# Patient Record
Sex: Female | Born: 1940 | Race: White | Hispanic: No | Marital: Single | Smoking: Former smoker
Health system: Southern US, Community
[De-identification: ages and names within clinical notes are randomized; demographics above are authoritative.]

## PROBLEM LIST (undated history)

## (undated) DIAGNOSIS — C801 Malignant (primary) neoplasm, unspecified: Secondary | ICD-10-CM

## (undated) HISTORY — PX: APPENDECTOMY: SHX54

## (undated) HISTORY — PX: ABDOMINAL HYSTERECTOMY: SHX81

## (undated) HISTORY — PX: LUNG CANCER SURGERY: SHX702

---

## 1997-10-03 ENCOUNTER — Emergency Department (HOSPITAL_COMMUNITY): Admission: EM | Admit: 1997-10-03 | Discharge: 1997-10-03 | Payer: Self-pay | Admitting: Emergency Medicine

## 1998-02-12 ENCOUNTER — Emergency Department (HOSPITAL_COMMUNITY): Admission: EM | Admit: 1998-02-12 | Discharge: 1998-02-12 | Payer: Self-pay

## 1998-02-12 ENCOUNTER — Encounter: Payer: Self-pay | Admitting: Emergency Medicine

## 2007-12-31 ENCOUNTER — Ambulatory Visit (HOSPITAL_COMMUNITY): Admission: RE | Admit: 2007-12-31 | Discharge: 2007-12-31 | Payer: Self-pay | Admitting: Oncology

## 2008-01-08 ENCOUNTER — Ambulatory Visit: Payer: Self-pay | Admitting: Thoracic Surgery (Cardiothoracic Vascular Surgery)

## 2008-01-13 ENCOUNTER — Ambulatory Visit (HOSPITAL_COMMUNITY)
Admission: RE | Admit: 2008-01-13 | Discharge: 2008-01-13 | Payer: Self-pay | Admitting: Thoracic Surgery (Cardiothoracic Vascular Surgery)

## 2008-01-13 ENCOUNTER — Encounter: Payer: Self-pay | Admitting: Thoracic Surgery (Cardiothoracic Vascular Surgery)

## 2008-01-14 ENCOUNTER — Encounter: Payer: Self-pay | Admitting: Thoracic Surgery (Cardiothoracic Vascular Surgery)

## 2008-01-14 ENCOUNTER — Ambulatory Visit: Payer: Self-pay | Admitting: Thoracic Surgery (Cardiothoracic Vascular Surgery)

## 2008-01-14 ENCOUNTER — Ambulatory Visit (HOSPITAL_COMMUNITY)
Admission: RE | Admit: 2008-01-14 | Discharge: 2008-01-15 | Payer: Self-pay | Admitting: Thoracic Surgery (Cardiothoracic Vascular Surgery)

## 2010-06-23 IMAGING — PT NM PET TUM IMG SKULL BASE T - THIGH
6 series · 25 of 25 positions shown · non-contrast
Comparison: CT of the chest from 12/08/2007 (Fransisco Arthur)

CLINICAL DATA: Lung cancer

NUCLEAR MEDICINE PET/CT
TECHNIQUE: 17.2 mCi F-18 FDG was injected intravenously.  Full-
ring PET imaging was performed from the skull base through the mid-
thighs.  CT data was obtained and used for attenuation correction
and anatomic localization only.  (This was not acquired as a
diagnostic CT examination.)  Data regarding site of injection,
patient weight, post injection waiting period, and fasting blood
sugar is available in the PACS documentation.

[Series 1: pet ac · axial · 3.3mm · 4.69mm/px · z∈[-870,+0]mm · 5 of 267 slices shown]
[im 1/267]
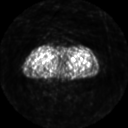
[im 67/267]
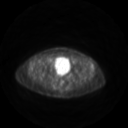
[im 134/267]
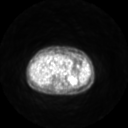
[im 200/267]
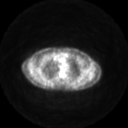
[im 267/267]
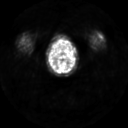

[Series 2: pet nac · axial · 3.3mm · 4.69mm/px · z∈[-870,+0]mm · 6 of 267 slices shown]
[im 1/267]
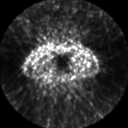
[im 54/267]
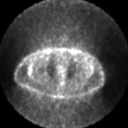
[im 107/267]
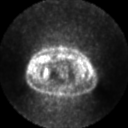
[im 160/267]
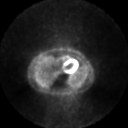
[im 213/267]
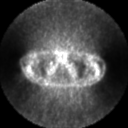
[im 267/267]
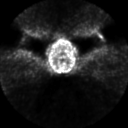

[Series 2: ct images · axial · 3.8mm · 0.98mm/px · z∈[-870,+0]mm · 6 of 267 slices shown]
[im 1/267]
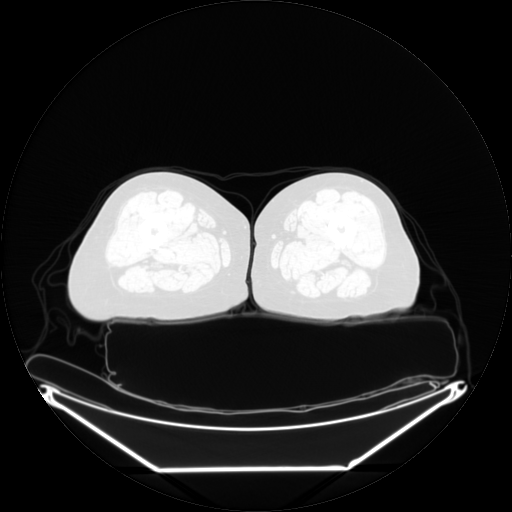
[im 54/267]
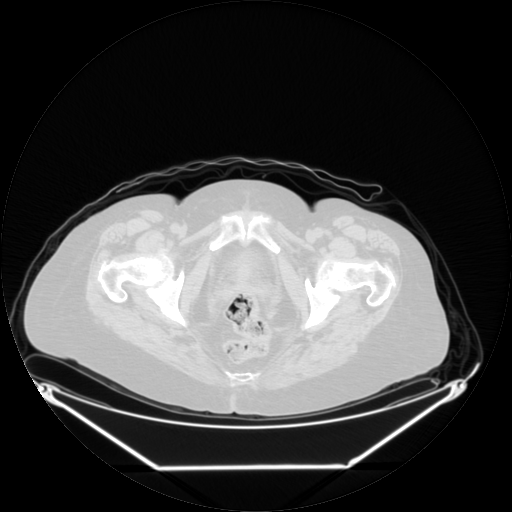
[im 107/267]
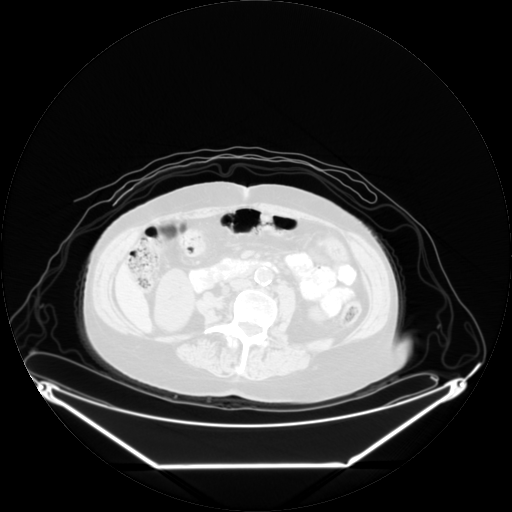
[im 160/267]
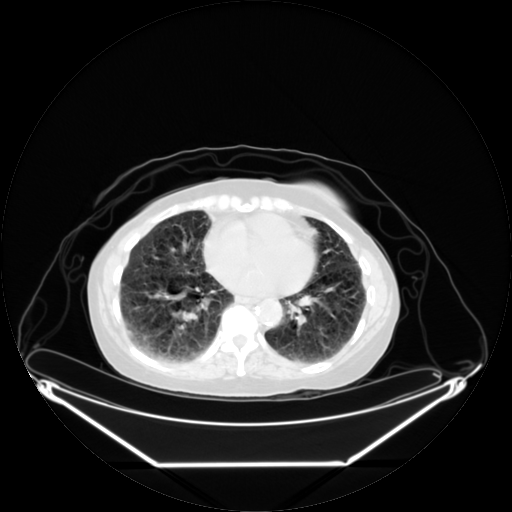
[im 213/267]
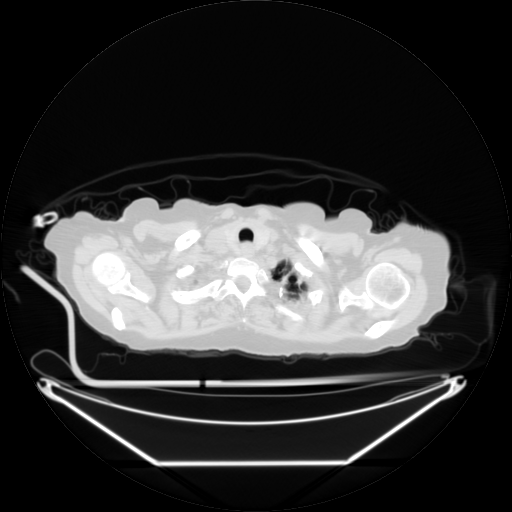
[im 267/267  brain]
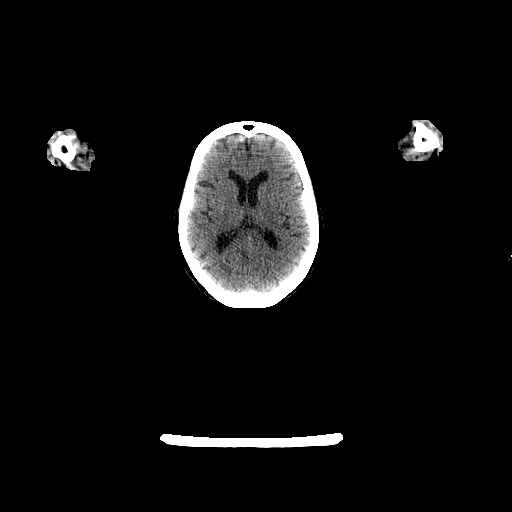

[Series 123: mip · coronal · 3.3mm · 4.69mm/px · 1 of 30 slices shown]
[im 1/30]
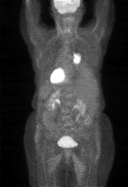

[Series 151: reformatted · axial · 3.3mm · 3.91mm/px · z∈[-870,+0]mm · 6 of 265 slices shown (1 of 2)]
[im 1/265]
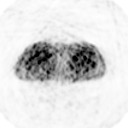
[im 53/265]
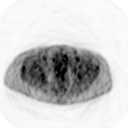
[im 106/265]
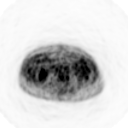
[im 159/265]
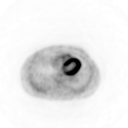
[im 212/265]
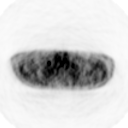
[im 265/265]
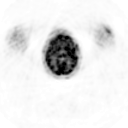

[Series 153: reformatted · coronal · 4.7mm · 6.98mm/px · 1 of 68 slices shown (2 of 2)]
[im 1/68]
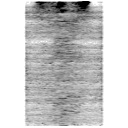

[25 of 25 positions shown; findings below may reference images not displayed]

FINDINGS: The right upper lobe mass has a maximum standard uptake
value of 15.3 grams per mL.

Physiologic activity in the neck is noted.  The 2.2 x 1.2 cm
precarinal lymph node does not demonstrate definite increased FDG
activity.  Very indistinct activity along the right hilum on image
85 of series of the fused PET CT series could conceivably represent
early right hilar adenopathy.

The 2.7 x 2.0 cm left adrenal mass has low density of 2 HU, but has
faintly increased FDG uptake, with a maximum standard uptake value
of 3.5 grams per mL.  Occasionally, adrenal adenomas can have
mildly increased uptake in this range, which is the most likely
scenario given the low density of this lesion.  A collision lesion
(with a metastatic focus superimposed on an underlying adenoma) is
a differential diagnostic consideration.

The right adrenal gland appears unremarkable.  A small hiatal
hernia is present.

There is atherosclerotic calcification of the abdominal aorta.  No
pathologic retroperitoneal adenopathy identified.

There is levoconvex lumbar scoliosis with a rotary component.

Severe emphysema is present.
IMPRESSION: 1. The right upper lobe mass is intensely hypermetabolic,
compatible with malignancy.
2.  The abnormally enlarged lower precarinal lymph node does not
demonstrate definite increased FDG activity.
3.  Faint right hilar activity raise the possibility of metastatic
disease to right hilar lymph nodes, but is nonspecific due to the
subtlety of this finding.
4.  Left adrenal mass has low density characteristics typical of
adenoma, but does have low-level FDG activity.  I favor this
representing the occasionally seen mild hypermetabolic activity in
an adenoma, but the possibility of a collision lesion is not
totally excluded, and attention to the adrenal gland on follow-up
imaging is recommended.
5.  Severe emphysema.
6.  Lumbar scoliosis and spondylosis.

## 2010-07-10 NOTE — Consult Note (Signed)
NEW PATIENT CONSULTATION   CHEMERE, STEFFLER  DOB:  [birth year]                                        January 08, 2008  CHART #:  [id number]   REASON FOR CONSULTATION:  Right upper lobe squamous cell carcinoma.   HISTORY OF PRESENT ILLNESS:  The patient is a 70 year old woman with a  chief complaint of cough and wheezing.  The patient has a long history  of tobacco abuse and known COPD.  She states she has been having  difficulty with bronchitis over the past year and a half.  Recently, she  had worsening of her cough as well as shortness of breath, both with  exertion and at rest.  A chest x-ray revealed a right lung mass.  She  subsequently had a CT which showed a 4.5 x 3.4 x 5.2 cm right upper lobe  mass.  There also was some mediastinal adenopathy and an enlarged left  adrenal gland.  She was subsequently referred to Dr. Rennis Harding.  A  PET scan was done which showed avid increased uptake in the primary  mass.  There also was increased uptake in some hilar nodes around the  left upper lobe bronchus.  There was not significant increased uptake in  the mediastinal nodes despite they are being approximately 2 cm in  diameter.  There was increased uptake in the adrenal gland, but it was  felt that this might be a FDG avid adenoma rather than metastasis.   PAST MEDICAL HISTORY:  Significant for COPD, arthritis.  She  specifically denies diabetes, hypertension, hypercholesterolemia, or  cardiac disease.   CURRENT MEDICATIONS:  Tiotropium bromide inhaled daily, albuterol 2  puffs inhaled as needed, fluticasone and salmeterol 1 puff b.i.d.  inhaled, Combivent 1 puff as needed, mometasone furoate monohydrate 2  puffs nightly, alprazolam 0.5 mg p.o. t.i.d. p.r.n., and benzonatate 100  mg daily.   She has no known drug allergies.   PAST SURGICAL HISTORY:  Significant for left thoracotomy for  pneumothorax in the 1980s and partial hysterectomy.   FAMILY  HISTORY:  No history of premature coronary artery disease.   SOCIAL HISTORY:  She is retired.  She used to work in Designer, fashion/clothing.  She  smoked as many as 3 packs per day, but most commonly 2 packs per day for  roughly 40 years, so 80-100 pack-year history of smoking.  She does not  use alcohol.  She says she quit smoking 2 months ago.   REVIEW OF SYSTEMS:  She has had 10 pounds weight loss.  She does have  chest pain which she describes as intermittent stabbing pain in her  right chest, it is infrequent, it is not anginal in nature.  She does  have shortness of breath with exertion, either walking or climbing  steps.  She is not sure if she can go up a full flight of stairs without  having to stop.  She has had some scant hemoptysis.  She does have  arthritis and joint pain.  She does have anxiety.  All other systems are  negative.   PHYSICAL EXAMINATION:  GENERAL:  The patient is a 70 year old woman, who  appears older than her stated age.  VITAL SIGNS:  Her blood pressure is 103/63, pulse 102, respirations 18,  and her oxygen saturation is 94% on room air.  NEUROLOGIC:  She is anxious, but alert and oriented x3 with no focal  deficits.  HEENT:  Unremarkable.  NECK:  Supple without thyromegaly, adenopathy, or bruits.  There is no  cervical, supraclavicular, axillary, or epitrochlear adenopathy.  LUNGS:  She coughs with a deep breath.  There is no wheezing at the  present time.  She does have distant breath sounds.  CARDIAC:  Regular rate and rhythm.  Normal S1 and S2.  No rubs, murmurs,  or gallops.  EXTREMITIES:  There is no peripheral edema.  She has 1+ posterior tibial  pulses and 2+ radial pulses bilaterally.   Her PET scan is reviewed.  Findings are as previously mentioned.  She  has a 5 cm right upper lobe mass with an SUV of 15.  There is increased  uptake in some hilar nodes.  The enlarged paratracheal nodes do not have  increased uptake and the left adrenal mass has question  adenoma, but  malignancy cannot be excluded.  She does have severe emphysematous  changes in both lungs on the lung windows.   PFTs were done in the office.  We are waiting the results for full PFT  testing, but in the office, her FEV-1 was 1.36 which is 52% predicted.  FEV-1 to FVC ratio is 65%.  Her needle biopsy showed squamous cell  carcinoma, moderate-to-poorly differentiated.   IMPRESSION:  The patient is a 70 year old woman with a history of heavy  tobacco abuse and chronic obstructive pulmonary disease, who presents  with a newly discovered right lung mass.  This by CT scan appeared to be  at least stage III if not stage IV disease.  However, by PET scan, it  would appear most likely to be a stage II lesion.  It is not definitive  staging by any stretch of the imagination.  She does need definitive  staging before potentially embarking on therapy.  I do have some  concerns about her ability to withstand a lobectomy or major thoracic  operation and await the results of her full PFT testing that was done in  [place] before making a final determination of that nature.  I do think  given the appearance on CT scan of the lymph nodes with them being  enlarged, there is a possibility of a false negative PET.  She needs a  mediastinoscopy for definitive staging or mediastinal lymph nodes.  However, before we do that, I would recommend that we at least attempted  needle biopsy of this adrenal.  It may well be an adenoma that just has  some increased uptake, but obviously if there were metastatic disease in  the adrenal gland, she would be stage IV.  No surgery would be  warranted.  We are in an attempt to schedule her for a needle biopsy of  the adrenal gland and Interventional Radiology, and then I discussed  with her the indications, risks, benefits, and alternatives for staging  with mediastinoscopy.  She understands this is a diagnostic and not  therapeutic procedure.  She does  understand the need for general  anesthesia.  She understands that the risks include but not limited to  death, bleeding, possible need for transfusion, possible need for larger  operation to control bleeding, infection, recurrent nerve injury,  esophageal injury, and pneumothorax.  She understands and accepts these  risks and agrees to proceed.  We will tentatively plan to proceed with  surgery on January 14, 2008, which is next Thursday, assuming we can  get the needle biopsy done before that time.  Obviously, we do not want  to put her through general anesthesia and the risk of mediastinoscopy if  the needle biopsy was positive.   Salvatore Decent Dorris Fetch, M.D.  Electronically Signed   SCH/MEDQ  D:  01/08/2008  T:  01/09/2008  Job:  [id number]   cc:   Weston Settle, MD  Tanvir A. Chodri, M.D.  [facility]

## 2010-07-10 NOTE — Op Note (Signed)
NAME:  Pamela Benton, Pamela Benton             ACCOUNT NO.:  [id number]   MEDICAL RECORD NO.:  [id number]          PATIENT TYPE:  OIB   LOCATION:  2002                         FACILITY:  [facility]   PHYSICIAN:  Salvatore Decent. Dorris Fetch, M.D.DATE OF BIRTH:  08-09-1940   DATE OF PROCEDURE:  01/14/2008  DATE OF DISCHARGE:                               OPERATIVE REPORT   PREOPERATIVE DIAGNOSIS:  Right upper lobe squamous cell carcinoma with  mediastinal adenopathy.   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   PROCEDURE:  Mediastinoscopy.   SURGEON:  Salvatore Decent. Dorris Fetch, MD   ANESTHESIA:  General.   FINDINGS:  Large granular level 7 nodes, markedly enlarged fleshy level  4R node x2.   CLINICAL NOTE:  Ms. Fuentes is a 70 year old woman with a history of  tobacco abuse, who presents with a new right upper lobe mass.  This was  biopsy proven to be squamous cell carcinoma.  A CT showed mediastinal  adenopathy as well as enlarged adrenal gland.  A PET scan showed intense  uptake in the primary lesion, no significant uptake in the mediastinal  nodes, and moderate uptake in the adrenal lesion.  She underwent needle  biopsy of the adrenal gland, which shows consistent with a benign  adenoma and now is undergoing these mediastinoscopy for formal staging.  The indications, risks, benefits, and alternatives were discussed in  detail with the patient.  She understands and accepts the risks and  agrees to proceed.   OPERATIVE NOTE:  Ms. Gammon was brought to the preop holding area on  January 14, 2008.  There, the Anesthesia Service established  intravenous access and placed an arterial blood pressure monitoring  line.  Intravenous antibiotics were administered.  She was taken to the  operating room, anesthetized, and intubated.  The chest and neck were  prepped and draped in usual sterile fashion.  A transverse incision was  made one fingerbreadth above  the sternal notch, it was carried through  the skin and subcutaneous tissue, and hemostasis was achieved with  electrocautery.  The strap muscles were separated in the midline.  The  pretracheal fascia was identified and incised.  The pretracheal plane  was developed bluntly into the mediastinum.  Mediastinoscope then was  inserted and systematic inspection of the mediastinal lymph nodes was  carried out.  What appeared to be a level 4R node was identified, with  aspiration this turned out to be azygos vein.  Bleeding was controlled  with Surgicel and packing for 5 minutes.  Packing then was removed, and  there was no ongoing bleeding from that site.  The subcarinal space then  was inspected and enlarged granular lymph nodes were seen in that area.  Biopsies were taken from 2 nodes, one of these was sent for frozen  section, and the other was sent only for permanent section.  Frozen  section ultimately returned with no sign of malignancy.  There were no  identifiable nodes in the 4L distribution consistent with the CT scan  findings.  Next, the level 4R nodes were inspected and an  enlarged  fleshy grayish node was identified.  This was biopsied.  A smaller node  with similar appearance likewise was biopsied.  These nodes were  relatively vascular and they were sent for permanent section.  The wound  then was packed with gauze for 5 minutes.  The gauze packing was  removed.  The mediastinoscope was reinserted.  Systematic inspection for  hemostasis revealed no ongoing bleeding.  Mediastinoscope was withdrawn.  The incision was closed in 2 layers with a 3-0 Vicryl  subcutaneous closure and a 4-0 Vicryl subcuticular closure.  All sponge,  needle, and instrument counts were correct at the end of the procedure.  The patient was extubated in the operating room and taken to recovery  room in good condition.      Salvatore Decent Dorris Fetch, M.D.  Electronically Signed     SCH/MEDQ  D:   01/14/2008  T:  01/14/2008  Job:  [id number]   cc:   Weston Settle, MD  Tanvir A. Chodri, M.D.  [facility]

## 2010-11-27 LAB — GLUCOSE, CAPILLARY: Glucose-Capillary: 90

## 2010-11-28 LAB — CBC
HCT: 33.4 — ABNORMAL LOW
Hemoglobin: 11.3 — ABNORMAL LOW
MCV: 84.9
Platelets: 247
RBC: 3.85 — ABNORMAL LOW
RDW: 14.9
WBC: 9.8

## 2010-11-28 LAB — COMPREHENSIVE METABOLIC PANEL
ALT: 10
AST: 13
Albumin: 3 — ABNORMAL LOW
Alkaline Phosphatase: 67
Alkaline Phosphatase: 67
BUN: 7
Chloride: 109
GFR calc Af Amer: >60
Glucose, Bld: 103 — ABNORMAL HIGH
Potassium: 3 — ABNORMAL LOW
Potassium: 3.5
Sodium: 140
Total Bilirubin: 0.3
Total Bilirubin: 0.4
Total Protein: 6.8

## 2010-11-28 LAB — TYPE AND SCREEN
Antibody Screen: POSITIVE
PT AG Type: POSITIVE

## 2010-11-28 LAB — APTT: aPTT: 34

## 2010-11-28 LAB — PROTIME-INR
INR: 1.1
Prothrombin Time: 14.1

## 2013-10-13 DIAGNOSIS — R413 Other amnesia: Secondary | ICD-10-CM

## 2015-04-21 ENCOUNTER — Inpatient Hospital Stay (HOSPITAL_COMMUNITY)
Admission: EM | Admit: 2015-04-21 | Discharge: 2015-04-29 | DRG: 193 | Disposition: A | Discharge status: Hospice | Payer: PPO | Attending: Internal Medicine | Admitting: Internal Medicine

## 2015-04-21 ENCOUNTER — Emergency Department (HOSPITAL_COMMUNITY): Payer: PPO

## 2015-04-21 ENCOUNTER — Encounter (HOSPITAL_COMMUNITY): Payer: Self-pay | Admitting: *Deleted

## 2015-04-21 DIAGNOSIS — J9621 Acute and chronic respiratory failure with hypoxia: Secondary | ICD-10-CM | POA: Diagnosis present

## 2015-04-21 DIAGNOSIS — Z85118 Personal history of other malignant neoplasm of bronchus and lung: Secondary | ICD-10-CM

## 2015-04-21 DIAGNOSIS — J111 Influenza due to unidentified influenza virus with other respiratory manifestations: Secondary | ICD-10-CM

## 2015-04-21 DIAGNOSIS — Z90710 Acquired absence of both cervix and uterus: Secondary | ICD-10-CM

## 2015-04-21 DIAGNOSIS — E876 Hypokalemia: Secondary | ICD-10-CM | POA: Diagnosis not present

## 2015-04-21 DIAGNOSIS — R0602 Shortness of breath: Secondary | ICD-10-CM | POA: Diagnosis not present

## 2015-04-21 DIAGNOSIS — E860 Dehydration: Secondary | ICD-10-CM | POA: Diagnosis present

## 2015-04-21 DIAGNOSIS — Z7189 Other specified counseling: Secondary | ICD-10-CM | POA: Insufficient documentation

## 2015-04-21 DIAGNOSIS — Z9981 Dependence on supplemental oxygen: Secondary | ICD-10-CM

## 2015-04-21 DIAGNOSIS — Z7952 Long term (current) use of systemic steroids: Secondary | ICD-10-CM

## 2015-04-21 DIAGNOSIS — E871 Hypo-osmolality and hyponatremia: Secondary | ICD-10-CM

## 2015-04-21 DIAGNOSIS — J440 Chronic obstructive pulmonary disease with acute lower respiratory infection: Secondary | ICD-10-CM | POA: Diagnosis present

## 2015-04-21 DIAGNOSIS — J441 Chronic obstructive pulmonary disease with (acute) exacerbation: Secondary | ICD-10-CM | POA: Diagnosis not present

## 2015-04-21 DIAGNOSIS — R0902 Hypoxemia: Secondary | ICD-10-CM

## 2015-04-21 DIAGNOSIS — Z7401 Bed confinement status: Secondary | ICD-10-CM

## 2015-04-21 DIAGNOSIS — J449 Chronic obstructive pulmonary disease, unspecified: Secondary | ICD-10-CM | POA: Diagnosis not present

## 2015-04-21 DIAGNOSIS — Z8701 Personal history of pneumonia (recurrent): Secondary | ICD-10-CM

## 2015-04-21 DIAGNOSIS — Z87891 Personal history of nicotine dependence: Secondary | ICD-10-CM

## 2015-04-21 DIAGNOSIS — Z515 Encounter for palliative care: Secondary | ICD-10-CM

## 2015-04-21 DIAGNOSIS — J101 Influenza due to other identified influenza virus with other respiratory manifestations: Secondary | ICD-10-CM | POA: Diagnosis not present

## 2015-04-21 DIAGNOSIS — I4581 Long QT syndrome: Secondary | ICD-10-CM | POA: Diagnosis present

## 2015-04-21 DIAGNOSIS — J9620 Acute and chronic respiratory failure, unspecified whether with hypoxia or hypercapnia: Secondary | ICD-10-CM

## 2015-04-21 DIAGNOSIS — Z79899 Other long term (current) drug therapy: Secondary | ICD-10-CM

## 2015-04-21 DIAGNOSIS — Z9049 Acquired absence of other specified parts of digestive tract: Secondary | ICD-10-CM

## 2015-04-21 DIAGNOSIS — Z66 Do not resuscitate: Secondary | ICD-10-CM

## 2015-04-21 DIAGNOSIS — IMO0001 Reserved for inherently not codable concepts without codable children: Secondary | ICD-10-CM | POA: Diagnosis present

## 2015-04-21 DIAGNOSIS — Z902 Acquired absence of lung [part of]: Secondary | ICD-10-CM

## 2015-04-21 HISTORY — DX: Malignant (primary) neoplasm, unspecified: C80.1

## 2015-04-21 LAB — COMPREHENSIVE METABOLIC PANEL
ALK PHOS: 73 U/L (ref 38–126)
ALT: 25 U/L (ref 14–54)
ANION GAP: 13 (ref 5–15)
AST: 33 U/L (ref 15–41)
Albumin: 3.4 g/dL — ABNORMAL LOW (ref 3.5–5.0)
BILIRUBIN TOTAL: 0.9 mg/dL (ref 0.3–1.2)
BUN: 17 mg/dL (ref 6–20)
CALCIUM: 8.3 mg/dL — AB (ref 8.9–10.3)
CO2: 29 mmol/L (ref 22–32)
Chloride: 90 mmol/L — ABNORMAL LOW (ref 101–111)
Creatinine, Ser: 1.01 mg/dL — ABNORMAL HIGH (ref 0.44–1.00)
GFR calc non Af Amer: 53 mL/min — ABNORMAL LOW (ref >60)
Glucose, Bld: 100 mg/dL — ABNORMAL HIGH (ref 65–99)
Potassium: 2.9 mmol/L — ABNORMAL LOW (ref 3.5–5.1)
Sodium: 132 mmol/L — ABNORMAL LOW (ref 135–145)
TOTAL PROTEIN: 6.4 g/dL — AB (ref 6.5–8.1)

## 2015-04-21 LAB — CBC WITH DIFFERENTIAL/PLATELET
Basophils Absolute: 0 10*3/uL (ref 0.0–0.1)
Basophils Relative: 0 %
Eosinophils Absolute: 0 10*3/uL (ref 0.0–0.7)
Eosinophils Relative: 0 %
HEMATOCRIT: 32.4 % — AB (ref 36.0–46.0)
HEMOGLOBIN: 10.5 g/dL — AB (ref 12.0–15.0)
LYMPHS ABS: 0.2 10*3/uL — AB (ref 0.7–4.0)
Lymphocytes Relative: 5 %
MCH: 26.8 pg (ref 26.0–34.0)
MCHC: 32.4 g/dL (ref 30.0–36.0)
MCV: 82.7 fL (ref 78.0–100.0)
MONOS PCT: 6 %
Monocytes Absolute: 0.3 10*3/uL (ref 0.1–1.0)
NEUTROS ABS: 3.8 10*3/uL (ref 1.7–7.7)
NEUTROS PCT: 89 %
Platelets: 167 10*3/uL (ref 150–400)
RBC: 3.92 MIL/uL (ref 3.87–5.11)
RDW: 16.7 % — ABNORMAL HIGH (ref 11.5–15.5)
WBC: 4.2 10*3/uL (ref 4.0–10.5)

## 2015-04-21 LAB — TROPONIN I: Troponin I: <0.03 ng/mL (ref <0.031)

## 2015-04-21 MED ORDER — METHYLPREDNISOLONE SODIUM SUCC 40 MG IJ SOLR
40.0000 mg | Freq: Two times a day (BID) | INTRAMUSCULAR | Status: DC
  Administered 2015-04-21 – 2015-04-23 (×4): 40 mg via INTRAVENOUS
  Filled 2015-04-21 (×6): qty 1

## 2015-04-21 MED ORDER — ENOXAPARIN SODIUM 40 MG/0.4ML ~~LOC~~ SOLN
40.0000 mg | SUBCUTANEOUS | Status: DC
  Administered 2015-04-21 – 2015-04-28 (×8): 40 mg via SUBCUTANEOUS
  Filled 2015-04-21 (×8): qty 0.4

## 2015-04-21 MED ORDER — POTASSIUM CHLORIDE CRYS ER 20 MEQ PO TBCR
40.0000 meq | EXTENDED_RELEASE_TABLET | ORAL | Status: AC
  Administered 2015-04-21 (×2): 40 meq via ORAL
  Filled 2015-04-21 (×2): qty 2

## 2015-04-21 MED ORDER — IOHEXOL 350 MG/ML SOLN
100.0000 mL | Freq: Once | INTRAVENOUS | Status: AC | PRN
  Administered 2015-04-21: 100 mL via INTRAVENOUS

## 2015-04-21 MED ORDER — MEMANTINE HCL ER 28 MG PO CP24
28.0000 mg | ORAL_CAPSULE | Freq: Every day | ORAL | Status: DC
  Administered 2015-04-22 – 2015-04-29 (×8): 28 mg via ORAL
  Filled 2015-04-21 (×8): qty 1

## 2015-04-21 MED ORDER — IPRATROPIUM BROMIDE 0.02 % IN SOLN
0.5000 mg | Freq: Once | RESPIRATORY_TRACT | Status: AC
  Administered 2015-04-21: 0.5 mg via RESPIRATORY_TRACT
  Filled 2015-04-21: qty 2.5

## 2015-04-21 MED ORDER — ONDANSETRON HCL 4 MG/2ML IJ SOLN: 4.0000 mg | Freq: Four times a day (QID) | INTRAMUSCULAR | Status: DC | PRN

## 2015-04-21 MED ORDER — HYDROCOD POLST-CPM POLST ER 10-8 MG/5ML PO SUER
5.0000 mL | Freq: Two times a day (BID) | ORAL | Status: DC
  Administered 2015-04-21 – 2015-04-29 (×15): 5 mL via ORAL
  Filled 2015-04-21 (×15): qty 5

## 2015-04-21 MED ORDER — SODIUM CHLORIDE 0.9 % IV BOLUS (SEPSIS)
1000.0000 mL | Freq: Once | INTRAVENOUS | Status: AC
  Administered 2015-04-21: 1000 mL via INTRAVENOUS

## 2015-04-21 MED ORDER — ACETAMINOPHEN 650 MG RE SUPP: 650.0000 mg | Freq: Four times a day (QID) | RECTAL | Status: DC | PRN

## 2015-04-21 MED ORDER — IPRATROPIUM-ALBUTEROL 0.5-2.5 (3) MG/3ML IN SOLN
3.0000 mL | Freq: Four times a day (QID) | RESPIRATORY_TRACT | Status: DC
  Administered 2015-04-22 (×3): 3 mL via RESPIRATORY_TRACT
  Filled 2015-04-21 (×3): qty 3

## 2015-04-21 MED ORDER — ALBUTEROL SULFATE (2.5 MG/3ML) 0.083% IN NEBU
5.0000 mg | INHALATION_SOLUTION | Freq: Once | RESPIRATORY_TRACT | Status: AC
  Administered 2015-04-21: 5 mg via RESPIRATORY_TRACT
  Filled 2015-04-21: qty 6

## 2015-04-21 MED ORDER — ROFLUMILAST 500 MCG PO TABS
500.0000 ug | ORAL_TABLET | Freq: Every day | ORAL | Status: DC
  Administered 2015-04-22 – 2015-04-25 (×4): 500 ug via ORAL
  Filled 2015-04-21 (×4): qty 1

## 2015-04-21 MED ORDER — CITALOPRAM HYDROBROMIDE 20 MG PO TABS
20.0000 mg | ORAL_TABLET | Freq: Every day | ORAL | Status: DC
  Administered 2015-04-22 – 2015-04-29 (×8): 20 mg via ORAL
  Filled 2015-04-21 (×8): qty 1

## 2015-04-21 MED ORDER — POTASSIUM CHLORIDE 10 MEQ/100ML IV SOLN
10.0000 meq | INTRAVENOUS | Status: AC
  Administered 2015-04-21 (×2): 10 meq via INTRAVENOUS
  Filled 2015-04-21 (×2): qty 100

## 2015-04-21 MED ORDER — ALBUTEROL SULFATE (2.5 MG/3ML) 0.083% IN NEBU
2.5000 mg | INHALATION_SOLUTION | RESPIRATORY_TRACT | Status: DC | PRN
  Administered 2015-04-22: 2.5 mg via RESPIRATORY_TRACT
  Filled 2015-04-21: qty 3

## 2015-04-21 MED ORDER — ACETAMINOPHEN 325 MG PO TABS
650.0000 mg | ORAL_TABLET | Freq: Four times a day (QID) | ORAL | Status: DC | PRN
  Administered 2015-04-23 – 2015-04-25 (×2): 650 mg via ORAL
  Filled 2015-04-21 (×2): qty 2

## 2015-04-21 MED ORDER — FUROSEMIDE 40 MG PO TABS: 20.0000 mg | ORAL_TABLET | Freq: Every day | ORAL | Status: DC

## 2015-04-21 MED ORDER — METHYLPREDNISOLONE SODIUM SUCC 125 MG IJ SOLR
125.0000 mg | Freq: Once | INTRAMUSCULAR | Status: AC
  Administered 2015-04-21: 125 mg via INTRAVENOUS
  Filled 2015-04-21: qty 2

## 2015-04-21 MED ORDER — BUDESONIDE 0.25 MG/2ML IN SUSP
0.2500 mg | Freq: Two times a day (BID) | RESPIRATORY_TRACT | Status: DC
  Administered 2015-04-21 – 2015-04-29 (×16): 0.25 mg via RESPIRATORY_TRACT
  Filled 2015-04-21 (×18): qty 2

## 2015-04-21 MED ORDER — ONDANSETRON HCL 4 MG PO TABS: 4.0000 mg | ORAL_TABLET | Freq: Four times a day (QID) | ORAL | Status: DC | PRN

## 2015-04-21 NOTE — ED Notes (Signed)
Pt was eating some food and reports a sensation of "choking"  Pt able to talk in complete sentences.  No food was noted when this RN looked into pt's throat.  Lungs sounds clear bilaterally.  Pt was encouraged to cough.  After coughing, pt reports "choking" sensation has decreased.

## 2015-04-21 NOTE — ED Notes (Signed)
Pt was coughing prior to getting BP.  O2 sat dropped while pt was coughing.  Pt O2 improved after pt caught her breath.

## 2015-04-21 NOTE — Progress Notes (Signed)
EDCM spoke to patient and her son Mallie Mussel [facility]) at bedside.  Patient lives at home with her son Milbert Coulter during the week and then stays with either Mallie Mussel or or her son Abe People on the weekends.  Patient wears oxygen at home.  Patient's son reports patient has a concentrator that is on 4 liters and she also has a Marine scientist.  Patient reports her pcp is [place] [facility] in [place].  Has a pulmonologist Dr. Nila Nephew.  Patient was just recently discharged from [facility] on Wednesday of this week, for short term rehab after diagnosis of pneumonia.  Patient reports she cannot remember the name of the agency who provides her oxygen.  Patient to be admitted.  No further EDCM needs at this time.

## 2015-04-21 NOTE — ED Notes (Addendum)
Family states that pt was recently admitted and tx for Pneumonia in [place].  Pt showed a O2 de-saturation when pt was getting out of bed and standing.  O2 sat dropped to 77% on 4L.  Pt's O2 was able to return to 95% when pt rested.  Pt states she is on home O2 of 4-5L [place]. Dr. Venora Maples made aware.

## 2015-04-21 NOTE — ED Notes (Signed)
Respiratory called for breathing tx

## 2015-04-21 NOTE — ED Notes (Signed)
Per EMS report: pt coming from home and presents to the ED with respiratory distress.  Pt is a lung CA pt in remission.  Pt has had a productive cough for the past several days in which the pt did not call her MD about.  On EMS arrival, EMS noted upper wheezes on both sizes.  Pt is mission her left lower lobe. EMS gave pt 5mg  of albuterol with improvement to pt's wheezing. Pt a/o x 4 and normally ambulatory.

## 2015-04-21 NOTE — Progress Notes (Signed)
RT attempted to draw ABG and was unsuccessful. MD aware and order cancelled.

## 2015-04-21 NOTE — ED Notes (Signed)
Attempted to call floor for report , receiving Nurse unavailable at this time, will call back.

## 2015-04-21 NOTE — H&P (Addendum)
[facility] History and Physical  Pamela Benton [id number] DOB: 1940/12/02 DOA: 04/21/2015   PCP: No primary care provider on file.    Chief Complaint: cough, shortness of breath  HPI: Pamela Benton is a 75 y.o. female with COPD on 4 L of oxygen at home, history of lung cancer status post lobectomy who presents to the hospital for cough and dyspnea at rest. The patient was treated for pneumonia about a month ago and had improved however, developed a severe cough with shortness of breath at rest and with exertion a couple of days ago. Nebulizer treatments used at home did not help. She feels tightness in her chest. No complaint of fevers. Cough is productive of clear sputum. No sore throat and runny nose or watery eyes. She is noted to be quite short of breath in the ER and has persistent cough and is therefore being admitted.    General: The patient denies anorexia, fever, weight loss Cardiac: Denies chest pain, syncope, palpitations, pedal edema  Respiratory: Per history of present illness GI: Denies severe indigestion/heartburn, abdominal pain, nausea, vomiting, diarrhea and constipation GU: Denies hematuria, incontinence, dysuria  Musculoskeletal: Denies arthritis  Skin: Denies suspicious skin lesions Neurologic: Denies focal weakness or numbness, change in vision Psychiatry: Denies depression or anxiety. Hematologic: no easy bruising or bleeding  All other systems reviewed and found to be negative.  Past Medical History  Diagnosis Date  . Cancer ([place])     Lung CA    Past Surgical History  Procedure Laterality Date  . Abdominal hysterectomy    . Appendectomy    . Lung cancer surgery      Social History: She stopped smoking about 6 years ago, she does not drink alcohol Lives at home with husband    No Known Allergies  Family history:  No family history on file.    Prior to Admission medications   Medication Sig Start Date End Date Taking?  Authorizing Provider  ALPRAZolam Duanne Moron) 0.5 MG tablet Take 0.5 mg- she states she takes this only when necessary and has not been taking it on a daily basis    Yes Historical Provider, MD  citalopram (CELEXA) 20 MG tablet Take 20 mg by mouth daily.   Yes Historical Provider, MD  furosemide (LASIX) 20 MG tablet Take 20 mg by mouth daily.   Yes Historical Provider, MD  memantine (NAMENDA XR) 28 MG CP24 24 hr capsule Take 28 mg by mouth daily.   Yes Historical Provider, MD  roflumilast (DALIRESP) 500 MCG TABS tablet Take 500 mcg by mouth daily.   Yes Historical Provider, MD  She is also on prednisone 20 mg daily and a number of inhalers whose names she cannot recall   Physical Exam: Filed Vitals:   04/21/15 1450 04/21/15 1627 04/21/15 1630 04/21/15 1641  BP:  112/67 107/63   Pulse: 122 115 112   Temp:      TempSrc:      Resp: 19 24 26    SpO2: 96% 95% 96% 92%     General: Awake alert oriented 3, no acute distress HEENT: Normocephalic and Atraumatic, Mucous membranes pink                PERRLA; EOM intact; No scleral icterus,                 Nares: Patent, Oropharynx: Clear, Fair Dentition                 Neck: FROM,  no cervical lymphadenopathy, thyromegaly, carotid bruit or JVD;  Breasts: deferred CHEST WALL: No tenderness  CHEST: Noted to be short of breath at rest with a persistent cough-mild bilateral wheezing HEART: Regular rate and rhythm; no murmurs rubs or gallops - tachycardia BACK: No kyphosis or scoliosis; no CVA tenderness  GI: Positive Bowel Sounds, soft, non-tender; no masses, no organomegaly Rectal Exam: deferred MSK: No cyanosis, clubbing, or edema Genitalia: not examined  SKIN:  no rash or ulceration  CNS: Alert and Oriented x 4, Nonfocal exam, CN 2-12 intact  Labs on Admission:  Basic Metabolic Panel:  Recent Labs Lab 04/21/15 1408  NA 132*  K 2.9*  CL 90*  CO2 29  GLUCOSE 100*  BUN 17  CREATININE 1.01*  CALCIUM 8.3*   Liver Function  Tests:  Recent Labs Lab 04/21/15 1408  AST 33  ALT 25  ALKPHOS 73  BILITOT 0.9  PROT 6.4*  ALBUMIN 3.4*   No results for input(s): LIPASE, AMYLASE in the last 168 hours. No results for input(s): AMMONIA in the last 168 hours. CBC:  Recent Labs Lab 04/21/15 1408  WBC 4.2  NEUTROABS 3.8  HGB 10.5*  HCT 32.4*  MCV 82.7  PLT 167   Cardiac Enzymes:  Recent Labs Lab 04/21/15 1408  TROPONINI <0.03    BNP (last 3 results) No results for input(s): BNP in the last 8760 hours.  ProBNP (last 3 results) No results for input(s): PROBNP in the last 8760 hours.  CBG: No results for input(s): GLUCAP in the last 168 hours.  Radiological Exams on Admission: Dg Chest 2 View  04/21/2015  CLINICAL DATA:  Shortness of breath today. History of lung cancer. Initial encounter. EXAM: CHEST  2 VIEW COMPARISON:  None. FINDINGS: Coarse bibasilar airspace opacities are identified at appears most suggestive of fibrotic change. The lungs are emphysematous. A right hilar mass lesion is seen. Surgical clips are noted left axilla. There is no pneumothorax or pleural effusion. Heart size is normal. No pneumothorax. IMPRESSION: Emphysematous lungs with coarse bibasilar airspace opacities likely due to fibrosis although pneumonia cannot be excluded. Right hilar mass. Electronically Signed   By: Inge Rise M.D.   On: 04/21/2015 13:52   Ct Angio Chest Pe W/cm &/or Wo Cm  04/21/2015  CLINICAL DATA:  Respiratory distress in a patient with lung cancer. EXAM: CT ANGIOGRAPHY CHEST WITH CONTRAST TECHNIQUE: Multidetector CT imaging of the chest was performed using the standard protocol during bolus administration of intravenous contrast. Multiplanar CT image reconstructions and MIPs were obtained to evaluate the vascular anatomy. CONTRAST:  11mL OMNIPAQUE IOHEXOL 350 MG/ML SOLN COMPARISON:  None. FINDINGS: Mediastinum / Lymph Nodes: There is no axillary lymphadenopathy. No mediastinal lymphadenopathy. No  evidence for left hilar lymphadenopathy. 8 mm short axis lymph node is identified in the right hilum. The heart size is normal. No pericardial effusion. Coronary artery calcification is noted. No evidence for filling defect in the opacified pulmonary arteries although evaluation of the lower lobes is degraded by respiratory motion. No thoracic aortic aneurysm. No evidence for dissection flap in the thoracic aorta. Lungs / Pleura: Moderate to severe emphysema noted bilaterally. Volume loss in the right hemi thorax is associated with confluent soft tissue attenuation in the anteromedial right chest which may be related to previous radiation therapy. No evidence for pulmonary edema. No pleural effusion. Upper Abdomen: 1.6 x 2.1 cm cystic lesion identified in the left upper abdomen has been incompletely visualized, but is suspicious for a cystic lesion of the  pancreatic tail. MSK / Soft Tissues: Bone windows reveal no worrisome lytic or sclerotic osseous lesions. Review of the MIP images confirms the above findings. IMPRESSION: 1. No CT evidence for acute pulmonary embolus. 2. 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 Comparison to previous imaging studies is recommended to ensure that this is stable as recurrent neoplasm could have this appearance. 3. 2.1 cm cystic lesion in the left upper abdomen has been incompletely visualized but is suspicious for a cystic process in the tail the pancreas. Pancreatic protocol CT or MR could be used to further evaluate. Electronically Signed   By: Misty Stanley M.D.   On: 04/21/2015 16:01    EKG: Independently reviewed. Sinus tachycardia-QTC is 512 ms  Assessment/Plan Principal Problem:   COPD exacerbation (HCC) -She has severe COPD and is on 4 L of oxygen and chronic prednisone at home -Has been given IV Solu-Medrol -continue nebulizer treatments-add Tussionex for  cough -Continue Daliresp -Ordered influenza swab and respiratory viral panel  Active Problems:   Hyponatremia -DC Lasix and given normal saline fluid bolus as she appears dehydrated    Hypokalemia -Possibly from [place] by mouth and IV-rechecked tomorrow morning  Prolonged QT -Avoid medications that were further prolong QT    Consulted:   Code Status: DO NOT RESUSCITATE  Family Communication: Husband at bedside  DVT Prophylaxis: Lovenox  Time spent: 55 minutes  [place], MD [facility]  If 7PM-7AM, please contact night-coverage [email or web address] 04/21/2015, 5:05 PM

## 2015-04-21 NOTE — ED Notes (Signed)
Pt de-sats with coughing.  Pt is O2 sats returns to 96% on 4L .

## 2015-04-21 NOTE — ED Provider Notes (Signed)
CSN: [id number]     Arrival date & time 04/21/15  1301 History   First MD Initiated Contact with Patient 04/21/15 1313     Chief Complaint  Patient presents with  . Respiratory Distress      HPI Patient presents to the emergency department with increasing shortness of breath over the past several days.  Patient was recently hospitalized in outside hospital for pneumonia.  She is no longer on antibiotics.  She does wear home oxygen 2 L.  She is brought to the ER by EMS and EMS gave 5 mg of albuterol with some improvement in the patient's wheezing.  She continues to complain of shortness of breath on arrival to the emergency department.  No history of DVT or pulmonary embolism.  She denies new unilateral leg swelling.  Denies fevers or chills.  No productive cough.  Her breathing is moderate in severity.   Past Medical History  Diagnosis Date  . Cancer ([place])     Lung CA   Past Surgical History  Procedure Laterality Date  . Abdominal hysterectomy    . Appendectomy    . Lung cancer surgery     No family history on file. Social History  Substance Use Topics  . Smoking status: Former Research scientist (life sciences)  . Smokeless tobacco: None  . Alcohol Use: No   OB History    No data available     Review of Systems  All other systems reviewed and are negative.     Allergies  Review of patient's allergies indicates no known allergies.  Home Medications   Prior to Admission medications   Medication Sig Start Date End Date Taking? Authorizing Provider  ALPRAZolam Duanne Moron) 0.5 MG tablet Take 0.5 mg by mouth daily.   Yes Historical Provider, MD  citalopram (CELEXA) 20 MG tablet Take 20 mg by mouth daily.   Yes Historical Provider, MD  furosemide (LASIX) 20 MG tablet Take 20 mg by mouth daily.   Yes Historical Provider, MD  memantine (NAMENDA XR) 28 MG CP24 24 hr capsule Take 28 mg by mouth daily.   Yes Historical Provider, MD  roflumilast (DALIRESP) 500 MCG TABS tablet Take 500 mcg by mouth daily.    Yes Historical Provider, MD   BP 140/64 mmHg  Pulse 122  Temp(Src) 98.3 F (36.8 C) (Oral)  Resp 19  SpO2 96% Physical Exam  Constitutional: She is oriented to person, place, and time. She appears well-developed and well-nourished. No distress.  HENT:  Head: Normocephalic and atraumatic.  Eyes: EOM are normal.  Neck: Normal range of motion.  Cardiovascular: Normal rate, regular rhythm and normal heart sounds.   Pulmonary/Chest: Effort normal. She has wheezes.  Abdominal: Soft. She exhibits no distension. There is no tenderness.  Musculoskeletal: Normal range of motion. She exhibits no edema.  Neurological: She is alert and oriented to person, place, and time.  Skin: Skin is warm and dry.  Psychiatric: She has a normal mood and affect. Judgment normal.  Nursing note and vitals reviewed.   ED Course  Procedures (including critical care time) Labs Review Labs Reviewed  CBC WITH DIFFERENTIAL/PLATELET - Abnormal; Notable for the following:    Hemoglobin 10.5 (*)    HCT 32.4 (*)    RDW 16.7 (*)    Lymphs Abs 0.2 (*)    All other components within normal limits  COMPREHENSIVE METABOLIC PANEL - Abnormal; Notable for the following:    Sodium 132 (*)    Potassium 2.9 (*)    Chloride  90 (*)    Glucose, Bld 100 (*)    Creatinine, Ser 1.01 (*)    Calcium 8.3 (*)    Total Protein 6.4 (*)    Albumin 3.4 (*)    GFR calc non Af Amer 53 (*)    All other components within normal limits  TROPONIN I    Imaging Review Dg Chest 2 View  04/21/2015  CLINICAL DATA:  Shortness of breath today. History of lung cancer. Initial encounter. EXAM: CHEST  2 VIEW COMPARISON:  None. FINDINGS: Coarse bibasilar airspace opacities are identified at appears most suggestive of fibrotic change. The lungs are emphysematous. A right hilar mass lesion is seen. Surgical clips are noted left axilla. There is no pneumothorax or pleural effusion. Heart size is normal. No pneumothorax. IMPRESSION: Emphysematous  lungs with coarse bibasilar airspace opacities likely due to fibrosis although pneumonia cannot be excluded. Right hilar mass. Electronically Signed   By: Inge Rise M.D.   On: 04/21/2015 13:52   Ct Angio Chest Pe W/cm &/or Wo Cm  04/21/2015  CLINICAL DATA:  Respiratory distress in a patient with lung cancer. EXAM: CT ANGIOGRAPHY CHEST WITH CONTRAST TECHNIQUE: Multidetector CT imaging of the chest was performed using the standard protocol during bolus administration of intravenous contrast. Multiplanar CT image reconstructions and MIPs were obtained to evaluate the vascular anatomy. CONTRAST:  116mL OMNIPAQUE IOHEXOL 350 MG/ML SOLN COMPARISON:  None. FINDINGS: Mediastinum / Lymph Nodes: There is no axillary lymphadenopathy. No mediastinal lymphadenopathy. No evidence for left hilar lymphadenopathy. 8 mm short axis lymph node is identified in the right hilum. The heart size is normal. No pericardial effusion. Coronary artery calcification is noted. No evidence for filling defect in the opacified pulmonary arteries although evaluation of the lower lobes is degraded by respiratory motion. No thoracic aortic aneurysm. No evidence for dissection flap in the thoracic aorta. Lungs / Pleura: Moderate to severe emphysema noted bilaterally. Volume loss in the right hemi thorax is associated with confluent soft tissue attenuation in the anteromedial right chest which may be related to previous radiation therapy. No evidence for pulmonary edema. No pleural effusion. Upper Abdomen: 1.6 x 2.1 cm cystic lesion identified in the left upper abdomen has been incompletely visualized, but is suspicious for a cystic lesion of the pancreatic tail. MSK / Soft Tissues: Bone windows reveal no worrisome lytic or sclerotic osseous lesions. Review of the MIP images confirms the above findings. IMPRESSION: 1. No CT evidence for acute pulmonary embolus. 2. 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 Comparison to previous imaging studies is recommended to ensure that this is stable as recurrent neoplasm could have this appearance. 3. 2.1 cm cystic lesion in the left upper abdomen has been incompletely visualized but is suspicious for a cystic process in the tail the pancreas. Pancreatic protocol CT or MR could be used to further evaluate. Electronically Signed   By: Misty Stanley M.D.   On: 04/21/2015 16:01   I have personally reviewed and evaluated these images and lab results as part of my medical decision-making.   EKG Interpretation   Date/Time:  Friday April 21 2015 13:27:18 EST Ventricular Rate:  125 PR Interval:  95 QRS Duration: 63 QT Interval:  355 QTC Calculation: 512 R Axis:   20 Text Interpretation:  Sinus tachycardia Borderline repolarization  abnormality Prolonged QT interval No old tracing to compare Confirmed by  Jaspal Pultz  MD, Lennette Bihari ([id number]) on 04/21/2015 4:23:45 PM  MDM   Final diagnoses:  COPD exacerbation ([place])  Hypoxia    4:24 PM Patient is feeling better this time but she still not back to baseline.  Given her tachycardia the patient underwent CT scan of her chest to evaluate for PE.  There is no clear pneumonia or evidence of pulmonary embolism.  She will need to be admitted for ongoing bronchodilators.  IV steroids given.  COPD exacerbation.    Jola Schmidt, MD 04/21/15 [phone]

## 2015-04-22 DIAGNOSIS — J449 Chronic obstructive pulmonary disease, unspecified: Secondary | ICD-10-CM | POA: Diagnosis not present

## 2015-04-22 DIAGNOSIS — Z9049 Acquired absence of other specified parts of digestive tract: Secondary | ICD-10-CM | POA: Diagnosis not present

## 2015-04-22 DIAGNOSIS — E871 Hypo-osmolality and hyponatremia: Secondary | ICD-10-CM | POA: Diagnosis not present

## 2015-04-22 DIAGNOSIS — J9621 Acute and chronic respiratory failure with hypoxia: Secondary | ICD-10-CM | POA: Diagnosis present

## 2015-04-22 DIAGNOSIS — Z87891 Personal history of nicotine dependence: Secondary | ICD-10-CM | POA: Diagnosis not present

## 2015-04-22 DIAGNOSIS — J440 Chronic obstructive pulmonary disease with acute lower respiratory infection: Secondary | ICD-10-CM | POA: Diagnosis present

## 2015-04-22 DIAGNOSIS — Z90710 Acquired absence of both cervix and uterus: Secondary | ICD-10-CM | POA: Diagnosis not present

## 2015-04-22 DIAGNOSIS — E860 Dehydration: Secondary | ICD-10-CM | POA: Diagnosis present

## 2015-04-22 DIAGNOSIS — J441 Chronic obstructive pulmonary disease with (acute) exacerbation: Secondary | ICD-10-CM | POA: Diagnosis present

## 2015-04-22 DIAGNOSIS — Z79899 Other long term (current) drug therapy: Secondary | ICD-10-CM | POA: Diagnosis not present

## 2015-04-22 DIAGNOSIS — Z9981 Dependence on supplemental oxygen: Secondary | ICD-10-CM | POA: Diagnosis not present

## 2015-04-22 DIAGNOSIS — Z7189 Other specified counseling: Secondary | ICD-10-CM | POA: Diagnosis not present

## 2015-04-22 DIAGNOSIS — Z66 Do not resuscitate: Secondary | ICD-10-CM | POA: Diagnosis not present

## 2015-04-22 DIAGNOSIS — J9620 Acute and chronic respiratory failure, unspecified whether with hypoxia or hypercapnia: Secondary | ICD-10-CM | POA: Diagnosis not present

## 2015-04-22 DIAGNOSIS — Z902 Acquired absence of lung [part of]: Secondary | ICD-10-CM | POA: Diagnosis not present

## 2015-04-22 DIAGNOSIS — J101 Influenza due to other identified influenza virus with other respiratory manifestations: Secondary | ICD-10-CM | POA: Diagnosis present

## 2015-04-22 DIAGNOSIS — Z85118 Personal history of other malignant neoplasm of bronchus and lung: Secondary | ICD-10-CM | POA: Diagnosis not present

## 2015-04-22 DIAGNOSIS — Z8701 Personal history of pneumonia (recurrent): Secondary | ICD-10-CM | POA: Diagnosis not present

## 2015-04-22 DIAGNOSIS — I4581 Long QT syndrome: Secondary | ICD-10-CM | POA: Diagnosis present

## 2015-04-22 DIAGNOSIS — Z515 Encounter for palliative care: Secondary | ICD-10-CM | POA: Diagnosis not present

## 2015-04-22 DIAGNOSIS — E876 Hypokalemia: Secondary | ICD-10-CM | POA: Diagnosis not present

## 2015-04-22 DIAGNOSIS — Z7401 Bed confinement status: Secondary | ICD-10-CM | POA: Diagnosis not present

## 2015-04-22 DIAGNOSIS — R0602 Shortness of breath: Secondary | ICD-10-CM | POA: Diagnosis present

## 2015-04-22 DIAGNOSIS — Z7952 Long term (current) use of systemic steroids: Secondary | ICD-10-CM | POA: Diagnosis not present

## 2015-04-22 DIAGNOSIS — J111 Influenza due to unidentified influenza virus with other respiratory manifestations: Secondary | ICD-10-CM | POA: Diagnosis not present

## 2015-04-22 LAB — BASIC METABOLIC PANEL
Anion gap: 13 (ref 5–15)
BUN: 18 mg/dL (ref 6–20)
CHLORIDE: 99 mmol/L — AB (ref 101–111)
CO2: 24 mmol/L (ref 22–32)
CREATININE: 0.86 mg/dL (ref 0.44–1.00)
Calcium: 8.6 mg/dL — ABNORMAL LOW (ref 8.9–10.3)
GFR calc Af Amer: >60 mL/min (ref >60)
GFR calc non Af Amer: >60 mL/min (ref >60)
GLUCOSE: 89 mg/dL (ref 65–99)
Potassium: 4 mmol/L (ref 3.5–5.1)
SODIUM: 136 mmol/L (ref 135–145)

## 2015-04-22 LAB — MAGNESIUM: Magnesium: 2.1 mg/dL (ref 1.7–2.4)

## 2015-04-22 LAB — CBC
HEMATOCRIT: 33.7 % — AB (ref 36.0–46.0)
Hemoglobin: 10.5 g/dL — ABNORMAL LOW (ref 12.0–15.0)
MCH: 26.6 pg (ref 26.0–34.0)
MCHC: 31.2 g/dL (ref 30.0–36.0)
MCV: 85.5 fL (ref 78.0–100.0)
PLATELETS: 185 10*3/uL (ref 150–400)
RBC: 3.94 MIL/uL (ref 3.87–5.11)
RDW: 17 % — AB (ref 11.5–15.5)
WBC: 2.4 10*3/uL — ABNORMAL LOW (ref 4.0–10.5)

## 2015-04-22 LAB — INFLUENZA PANEL BY PCR (TYPE A & B)
H1N1 flu by pcr: NOT DETECTED
INFLAPCR: NEGATIVE
Influenza B By PCR: POSITIVE — AB

## 2015-04-22 MED ORDER — LORAZEPAM 2 MG/ML IJ SOLN
0.5000 mg | Freq: Once | INTRAMUSCULAR | Status: AC
  Administered 2015-04-22: 0.5 mg via INTRAVENOUS
  Filled 2015-04-22: qty 1

## 2015-04-22 MED ORDER — OSELTAMIVIR PHOSPHATE 75 MG PO CAPS
75.0000 mg | ORAL_CAPSULE | Freq: Two times a day (BID) | ORAL | Status: DC
  Administered 2015-04-22 – 2015-04-23 (×3): 75 mg via ORAL
  Filled 2015-04-22 (×4): qty 1

## 2015-04-22 MED ORDER — IPRATROPIUM-ALBUTEROL 0.5-2.5 (3) MG/3ML IN SOLN
3.0000 mL | RESPIRATORY_TRACT | Status: DC
  Administered 2015-04-22 – 2015-04-24 (×9): 3 mL via RESPIRATORY_TRACT
  Filled 2015-04-22 (×9): qty 3

## 2015-04-22 MED ORDER — BENZONATATE 100 MG PO CAPS
100.0000 mg | ORAL_CAPSULE | Freq: Three times a day (TID) | ORAL | Status: DC | PRN
  Administered 2015-04-22 – 2015-04-24 (×5): 100 mg via ORAL
  Filled 2015-04-22 (×5): qty 1

## 2015-04-22 MED ORDER — ZOLPIDEM TARTRATE 5 MG PO TABS
5.0000 mg | ORAL_TABLET | Freq: Every evening | ORAL | Status: DC | PRN
  Administered 2015-04-23 – 2015-04-26 (×4): 5 mg via ORAL
  Filled 2015-04-22 (×4): qty 1

## 2015-04-22 NOTE — Progress Notes (Signed)
[facility] Progress Note   Pamela Benton  [id number]  DOB: 04/21/1940  DOA: 04/21/2015 PCP: No primary care provider on file.  Brief narrative:  Pamela Benton is a 75 y.o. female with COPD on 4 L of oxygen at home, history of lung cancer status post lobectomy who presents to the hospital for cough and dyspnea at rest. The patient was treated for pneumonia about a month ago and had improved however, developed a severe cough with shortness of breath at rest and with exertion a couple of days ago. Nebulizer treatments used at home did not help. She feels tightness in her chest. No complaint of fevers. Cough is productive of clear sputum. No sore throat and runny nose or watery eyes. She is noted to be quite short of breath in the ER and has persistent cough and is therefore being admitted.  Subjective: Cough slightly better. Continues to have wheezing.   Assessment/Plan: Principal Problem:   COPD exacerbation/ Influenza B - cont nebs, steroids, Dileresp, Tussionex and supplemental O2 - have added Tamiflu as well  Active Problems:   Hyponatremia - improved with IV normal salilne and with holding Lasix    Hypokalemia - resolved with replacement  Prolonged QT - avoid medications that will further prolong QT   Antibiotics: Anti-infectives    Start     Dose/Rate Route Frequency Ordered Stop   04/22/15 1000  oseltamivir (TAMIFLU) capsule 75 mg     75 mg Oral 2 times daily 04/22/15 0744 04/27/15 0959     Code Status:     Code Status Orders        Start     Ordered   04/21/15 1654  Do not attempt resuscitation (DNR)   Continuous    Question Answer Comment  In the event of cardiac or respiratory ARREST Do not call a "code blue"   In the event of cardiac or respiratory ARREST Do not perform Intubation, CPR, defibrillation or ACLS   In the event of cardiac or respiratory ARREST Use medication by any route, position, wound care, and other measures to relive pain  and suffering. May use oxygen, suction and manual treatment of airway obstruction as needed for comfort.      04/21/15 1653    Code Status History    Date Active Date Inactive Code Status Order ID Comments User Context   04/21/2015  4:48 PM 04/21/2015  4:53 PM Full Code [id number]  Debbe Odea, MD ED    Advance Directive Documentation        Most Recent Value   Type of Advance Directive  Living will, Healthcare Power of Attorney   Pre-existing out of facility DNR order (yellow form or pink MOST form)     "MOST" Form in Place?       Family Communication:  Disposition Plan: home in 1-2 days DVT prophylaxis: Lovenox Consultants:  Procedures:     Objective: Filed Weights   04/21/15 2150  Weight: 82 kg (180 lb 12.4 oz)    Intake/Output Summary (Last 24 hours) at 04/22/15 1520 Last data filed at 04/22/15 1300  Gross per 24 hour  Intake    720 ml  Output    250 ml  Net    470 ml     Vitals Filed Vitals:   04/22/15 0048 04/22/15 0421 04/22/15 0515 04/22/15 1400  BP:  143/68  109/59  Pulse:  117  108  Temp:  97.8 F (36.6 C)  98.2 F (36.8 C)  TempSrc:  Oral  Oral  Resp:  48 24 22  Height:      Weight:      SpO2: 92% 95%  94%    Exam:  General:  Pt is alert, not in acute distress  HEENT: No icterus, No thrush, oral mucosa moist  Cardiovascular: regular rate and rhythm, S1/S2 No murmur  Respiratory: b/l rhonchi and wheezing- severe cough  Abdomen: Soft, +Bowel sounds, non tender, non distended, no guarding  MSK: No cyanosis or clubbing- no pedal edema   Data Reviewed: Basic Metabolic Panel:  Recent Labs Lab 04/21/15 1408 04/22/15 0607  NA 132* 136  K 2.9* 4.0  CL 90* 99*  CO2 29 24  GLUCOSE 100* 89  BUN 17 18  CREATININE 1.01* 0.86  CALCIUM 8.3* 8.6*  MG  --  2.1   Liver Function Tests:  Recent Labs Lab 04/21/15 1408  AST 33  ALT 25  ALKPHOS 73  BILITOT 0.9  PROT 6.4*  ALBUMIN 3.4*   No results for input(s): LIPASE, AMYLASE in the  last 168 hours. No results for input(s): AMMONIA in the last 168 hours. CBC:  Recent Labs Lab 04/21/15 1408 04/22/15 0607  WBC 4.2 2.4*  NEUTROABS 3.8  --   HGB 10.5* 10.5*  HCT 32.4* 33.7*  MCV 82.7 85.5  PLT 167 185   Cardiac Enzymes:  Recent Labs Lab 04/21/15 1408  TROPONINI <0.03   BNP (last 3 results) No results for input(s): BNP in the last 8760 hours.  ProBNP (last 3 results) No results for input(s): PROBNP in the last 8760 hours.  CBG: No results for input(s): GLUCAP in the last 168 hours.  No results found for this or any previous visit (from the past 240 hour(s)).   Studies: Dg Chest 2 View  04/21/2015  CLINICAL DATA:  Shortness of breath today. History of lung cancer. Initial encounter. EXAM: CHEST  2 VIEW COMPARISON:  None. FINDINGS: Coarse bibasilar airspace opacities are identified at appears most suggestive of fibrotic change. The lungs are emphysematous. A right hilar mass lesion is seen. Surgical clips are noted left axilla. There is no pneumothorax or pleural effusion. Heart size is normal. No pneumothorax. IMPRESSION: Emphysematous lungs with coarse bibasilar airspace opacities likely due to fibrosis although pneumonia cannot be excluded. Right hilar mass. Electronically Signed   By: Inge Rise M.D.   On: 04/21/2015 13:52   Ct Angio Chest Pe W/cm &/or Wo Cm  04/21/2015  CLINICAL DATA:  Respiratory distress in a patient with lung cancer. EXAM: CT ANGIOGRAPHY CHEST WITH CONTRAST TECHNIQUE: Multidetector CT imaging of the chest was performed using the standard protocol during bolus administration of intravenous contrast. Multiplanar CT image reconstructions and MIPs were obtained to evaluate the vascular anatomy. CONTRAST:  142mL OMNIPAQUE IOHEXOL 350 MG/ML SOLN COMPARISON:  None. FINDINGS: Mediastinum / Lymph Nodes: There is no axillary lymphadenopathy. No mediastinal lymphadenopathy. No evidence for left hilar lymphadenopathy. 8 mm short axis lymph node  is identified in the right hilum. The heart size is normal. No pericardial effusion. Coronary artery calcification is noted. No evidence for filling defect in the opacified pulmonary arteries although evaluation of the lower lobes is degraded by respiratory motion. No thoracic aortic aneurysm. No evidence for dissection flap in the thoracic aorta. Lungs / Pleura: Moderate to severe emphysema noted bilaterally. Volume loss in the right hemi thorax is associated with confluent soft tissue attenuation in the anteromedial right chest which may be related to previous radiation therapy. No evidence  for pulmonary edema. No pleural effusion. Upper Abdomen: 1.6 x 2.1 cm cystic lesion identified in the left upper abdomen has been incompletely visualized, but is suspicious for a cystic lesion of the pancreatic tail. MSK / Soft Tissues: Bone windows reveal no worrisome lytic or sclerotic osseous lesions. Review of the MIP images confirms the above findings. IMPRESSION: 1. No CT evidence for acute pulmonary embolus. 2. 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 Comparison to previous imaging studies is recommended to ensure that this is stable as recurrent neoplasm could have this appearance. 3. 2.1 cm cystic lesion in the left upper abdomen has been incompletely visualized but is suspicious for a cystic process in the tail the pancreas. Pancreatic protocol CT or MR could be used to further evaluate. Electronically Signed   By: Misty Stanley M.D.   On: 04/21/2015 16:01    Scheduled Meds:  Scheduled Meds: . budesonide (PULMICORT) nebulizer solution  0.25 mg Nebulization BID  . chlorpheniramine-HYDROcodone  5 mL Oral Q12H  . citalopram  20 mg Oral Daily  . enoxaparin (LOVENOX) injection  40 mg Subcutaneous Q24H  . ipratropium-albuterol  3 mL Nebulization QID  . memantine  28 mg Oral Daily  . methylPREDNISolone (SOLU-MEDROL)  injection  40 mg Intravenous Q12H  . oseltamivir  75 mg Oral BID  . roflumilast  500 mcg Oral Daily   Continuous Infusions:   Time spent on care of this patient: 29 min   [place], MD 04/22/2015, 3:20 PM    [facility] Office  [phone] Pager - Text Page per [email or web address] If 7PM-7AM, please contact night-coverage [email or web address]

## 2015-04-22 NOTE — Evaluation (Signed)
Physical Therapy Evaluation Patient Details Name: Pamela Benton MRN: [id number] DOB: Mar 20, 1940 Today's Date: 04/22/2015   History of Present Illness  ASENCION DEMEESTER is a 75 y.o. female with COPD on 4 L of oxygen at home, history of lung cancer status post lobectomy who presents to the hospital 04/21/15 for cough and dyspnea at rest. The patient was treated for pneumonia about a month ago. R/O flu,  Clinical Impression  The patient is mobilizing well, SOB with activity. Patient will benefit from PT while in acute care to address problems listed in the note below.    Follow Up Recommendations No PT follow up    Equipment Recommendations  None recommended by PT    Recommendations for Other Services       Precautions / Restrictions Precautions Precaution Comments: droplet      Mobility  Bed Mobility Overal bed mobility: Independent                Transfers Overall transfer level: Needs assistance Equipment used: None Transfers: Sit to/from Stand;Stand Pivot Transfers Sit to Stand: Supervision Stand pivot transfers: Supervision       General transfer comment: quite mobile but dyspnea 3/4- sats 89% 4 L  HR 109  Ambulation/Gait             General Gait Details: NT due to SOB  Stairs            Wheelchair Mobility    Modified Rankin (Stroke Patients Only)       Balance Overall balance assessment: Needs assistance Sitting-balance support: Feet supported;No upper extremity supported Sitting balance-Leahy Scale: Good     Standing balance support: During functional activity;No upper extremity supported Standing balance-Leahy Scale: Fair                               Pertinent Vitals/Pain Pain Assessment: No/denies pain    Home Living Family/patient expects to be discharged to:: Private residence Living Arrangements: Children Available Help at Discharge: Family Type of Home: House Home Access: Stairs to enter   Engineer, site of Steps: 2-3-son Home Layout: One level Home Equipment: Environmental consultant - 4 wheels      Prior Function Level of Independence: Independent with assistive device(s)               Hand Dominance        Extremity/Trunk Assessment   Upper Extremity Assessment: Overall WFL for tasks assessed           Lower Extremity Assessment: Overall WFL for tasks assessed      Cervical / Trunk Assessment: Normal  Communication   Communication: No difficulties (except dyspneic)  Cognition Arousal/Alertness: Awake/alert Behavior During Therapy: WFL for tasks assessed/performed Overall Cognitive Status: Within Functional Limits for tasks assessed                      General Comments      Exercises        Assessment/Plan    PT Assessment Patient needs continued PT services  PT Diagnosis Difficulty walking   PT Problem List Decreased activity tolerance;Decreased mobility;Cardiopulmonary status limiting activity  PT Treatment Interventions DME instruction;Gait training;Functional mobility training;Therapeutic activities;Patient/family education   PT Goals (Current goals can be found in the Care Plan section) Acute Rehab PT Goals Patient Stated Goal: to go home PT Goal Formulation: With patient Time For Goal Achievement: 05/06/15 Potential to Achieve Goals: Good  Frequency Min 3X/week   Barriers to discharge        Co-evaluation               End of Session   Activity Tolerance: Patient tolerated treatment well (but limited bt SOB) Patient left: in chair;with call bell/phone within reach;with chair alarm set Nurse Communication: Mobility status    Functional Assessment Tool Used: clinical judgement Functional Limitation: Mobility: Walking and moving around Mobility: Walking and Moving Around Current Status [id number]): At least 20 percent but less than 40 percent impaired, limited or restricted Mobility: Walking and Moving Around Goal Status  [phone]): At least 1 percent but less than 20 percent impaired, limited or restricted    Time: [id number] PT Time Calculation (min) (ACUTE ONLY): 11 min   Charges:   PT Evaluation $PT Eval Low Complexity: 1 Procedure     PT G Codes:   PT G-Codes **NOT FOR INPATIENT CLASS** Functional Assessment Tool Used: clinical judgement Functional Limitation: Mobility: Walking and moving around Mobility: Walking and Moving Around Current Status [id number]): At least 20 percent but less than 40 percent impaired, limited or restricted Mobility: Walking and Moving Around Goal Status [phone]): At least 1 percent but less than 20 percent impaired, limited or restricted    Miyla, Toma 04/22/2015, 3:46 PM Tresa Endo PT [phone]

## 2015-04-23 ENCOUNTER — Inpatient Hospital Stay (HOSPITAL_COMMUNITY): Payer: PPO

## 2015-04-23 DIAGNOSIS — J441 Chronic obstructive pulmonary disease with (acute) exacerbation: Secondary | ICD-10-CM

## 2015-04-23 DIAGNOSIS — J449 Chronic obstructive pulmonary disease, unspecified: Secondary | ICD-10-CM

## 2015-04-23 LAB — BRAIN NATRIURETIC PEPTIDE: B Natriuretic Peptide: 36 pg/mL (ref 0.0–100.0)

## 2015-04-23 LAB — COMPREHENSIVE METABOLIC PANEL
ALBUMIN: 3.4 g/dL — AB (ref 3.5–5.0)
ALK PHOS: 72 U/L (ref 38–126)
ALT: 27 U/L (ref 14–54)
ANION GAP: 13 (ref 5–15)
AST: 37 U/L (ref 15–41)
BILIRUBIN TOTAL: 0.5 mg/dL (ref 0.3–1.2)
BUN: 23 mg/dL — AB (ref 6–20)
CALCIUM: 9 mg/dL (ref 8.9–10.3)
CO2: 26 mmol/L (ref 22–32)
Chloride: 98 mmol/L — ABNORMAL LOW (ref 101–111)
Creatinine, Ser: 0.93 mg/dL (ref 0.44–1.00)
GFR calc Af Amer: >60 mL/min (ref >60)
GFR calc non Af Amer: 59 mL/min — ABNORMAL LOW (ref >60)
GLUCOSE: 113 mg/dL — AB (ref 65–99)
Potassium: 4.5 mmol/L (ref 3.5–5.1)
Sodium: 137 mmol/L (ref 135–145)
TOTAL PROTEIN: 6.5 g/dL (ref 6.5–8.1)

## 2015-04-23 LAB — CBC
HCT: 35.7 % — ABNORMAL LOW (ref 36.0–46.0)
Hemoglobin: 10.9 g/dL — ABNORMAL LOW (ref 12.0–15.0)
MCH: 26.5 pg (ref 26.0–34.0)
MCHC: 30.5 g/dL (ref 30.0–36.0)
MCV: 86.7 fL (ref 78.0–100.0)
Platelets: 214 10*3/uL (ref 150–400)
RBC: 4.12 MIL/uL (ref 3.87–5.11)
RDW: 17 % — ABNORMAL HIGH (ref 11.5–15.5)
WBC: 4.4 10*3/uL (ref 4.0–10.5)

## 2015-04-23 LAB — BLOOD GAS, ARTERIAL
Acid-Base Excess: 5.1 mmol/L — ABNORMAL HIGH (ref 0.0–2.0)
BICARBONATE: 28.5 meq/L — AB (ref 20.0–24.0)
Drawn by: 257701
O2 Content: 4 L/min
O2 Saturation: 95.5 %
PCO2 ART: 38.6 mmHg (ref 35.0–45.0)
PH ART: 7.481 — AB (ref 7.350–7.450)
PO2 ART: 78.1 mmHg — AB (ref 80.0–100.0)
Patient temperature: 98.6
TCO2: 25.9 mmol/L (ref 0–100)

## 2015-04-23 LAB — MRSA PCR SCREENING: MRSA BY PCR: NEGATIVE

## 2015-04-23 MED ORDER — ALPRAZOLAM 0.5 MG PO TABS: 0.5000 mg | ORAL_TABLET | Freq: Every day | ORAL | Status: DC

## 2015-04-23 MED ORDER — DEXTROSE 5 % IV SOLN
100.0000 mg | Freq: Two times a day (BID) | INTRAVENOUS | Status: DC
  Administered 2015-04-23 – 2015-04-27 (×8): 100 mg via INTRAVENOUS
  Filled 2015-04-23 (×11): qty 100

## 2015-04-23 MED ORDER — LEVOFLOXACIN 500 MG PO TABS: 500.0000 mg | ORAL_TABLET | Freq: Every day | ORAL | Status: DC

## 2015-04-23 MED ORDER — ALPRAZOLAM 0.25 MG PO TABS
0.2500 mg | ORAL_TABLET | Freq: Once | ORAL | Status: AC
  Administered 2015-04-23: 0.25 mg via ORAL
  Filled 2015-04-23: qty 1

## 2015-04-23 MED ORDER — OSELTAMIVIR PHOSPHATE 30 MG PO CAPS
30.0000 mg | ORAL_CAPSULE | Freq: Two times a day (BID) | ORAL | Status: DC
  Administered 2015-04-23: 30 mg via ORAL
  Filled 2015-04-23 (×3): qty 1

## 2015-04-23 MED ORDER — GUAIFENESIN ER 600 MG PO TB12
1200.0000 mg | ORAL_TABLET | Freq: Two times a day (BID) | ORAL | Status: DC
  Administered 2015-04-23 – 2015-04-29 (×13): 1200 mg via ORAL
  Filled 2015-04-23 (×13): qty 2

## 2015-04-23 MED ORDER — ALBUTEROL SULFATE (2.5 MG/3ML) 0.083% IN NEBU: 2.5000 mg | INHALATION_SOLUTION | RESPIRATORY_TRACT | Status: AC

## 2015-04-23 MED ORDER — METHYLPREDNISOLONE SODIUM SUCC 125 MG IJ SOLR
60.0000 mg | Freq: Two times a day (BID) | INTRAMUSCULAR | Status: DC
  Administered 2015-04-23 – 2015-04-27 (×8): 60 mg via INTRAVENOUS
  Filled 2015-04-23 (×3): qty 2
  Filled 2015-04-23: qty 0.96
  Filled 2015-04-23 (×2): qty 2
  Filled 2015-04-23: qty 0.96
  Filled 2015-04-23 (×3): qty 2

## 2015-04-23 MED ORDER — ALBUTEROL SULFATE (2.5 MG/3ML) 0.083% IN NEBU
2.5000 mg | INHALATION_SOLUTION | RESPIRATORY_TRACT | Status: DC | PRN
  Administered 2015-04-24 – 2015-04-29 (×3): 2.5 mg via RESPIRATORY_TRACT
  Filled 2015-04-23 (×4): qty 3

## 2015-04-23 MED ORDER — BENZONATATE 100 MG PO CAPS
200.0000 mg | ORAL_CAPSULE | Freq: Three times a day (TID) | ORAL | Status: DC
  Administered 2015-04-23 – 2015-04-29 (×17): 200 mg via ORAL
  Filled 2015-04-23 (×17): qty 2

## 2015-04-23 MED ORDER — ALPRAZOLAM 0.5 MG PO TABS
0.5000 mg | ORAL_TABLET | Freq: Every day | ORAL | Status: DC
  Administered 2015-04-24 – 2015-04-26 (×3): 0.5 mg via ORAL
  Filled 2015-04-23 (×3): qty 1

## 2015-04-23 NOTE — Progress Notes (Signed)
Pt becoming extremely anxious after coughing spells with complaints of not being able to "catch her breath". Pt calling out to nurse's station multiple times after coughing.Pt encouraged to take slow deep breaths, sats remained 89-94% on 4L O2. Respiratory called for breathing treatment. K.Sofia,NP called and notified. Ativan 0.5mg  given IV to decrease anxiety.

## 2015-04-23 NOTE — Progress Notes (Signed)
Nutrition Brief Note  RD consulted via COPD gold protocol.   Patient with no reported weight loss and is eating 100% of her meals.  Wt Readings from Last 15 Encounters:  04/21/15 180 lb 12.4 oz (82 kg)    Body mass index is 31.02 kg/(m^2). Patient meets criteria for obesity based on current BMI.   Current diet order is Heart Healthy, patient is consuming approximately 100% of meals at this time. Labs and medications reviewed.   No nutrition interventions warranted at this time. If nutrition issues arise, please consult RD.   Clayton Bibles, MS, RD, LDN Pager: [phone] After Hours Pager: [phone]

## 2015-04-23 NOTE — Progress Notes (Signed)
Came into room, patient sitting on side of the bed with shortness of breath with noted exertion.  Sats 94% on 5L.  MD notified.  Gave order to transfer patient to stepdown.  Report called.  Patient trasnfered to room 1231.

## 2015-04-23 NOTE — Consult Note (Signed)
PULMONARY / CRITICAL CARE MEDICINE   Name: Pamela Benton MRN: [id number] DOB: 1940-07-31    ADMISSION DATE:  04/21/2015 CONSULTATION DATE:  04/23/15  REFERRING MD:  Reggy Eye  CHIEF COMPLAINT:  COPD exacerbation, Influenza infection.  HISTORY OF PRESENT ILLNESS:   Pamela Benton is a 75 year old with severe COPD on 4 Lt home O2. She follows with pulmonary at [facility]. She was admitted on 2/24 with acute exacerbation of COPD found to be fluB positive.  She was started on Tamiflu on 2/25. She is also on doxycycline, steroids, nebs. Chest imaging shows no PE but bilateral lower lobe infiltrates. PCCM consulted because of worsening O2 sats.  PAST MEDICAL HISTORY :  She  has a past medical history of Cancer ([place]).  PAST SURGICAL HISTORY: She  has past surgical history that includes Abdominal hysterectomy; Appendectomy; and Lung cancer surgery.  No Known Allergies  No current facility-administered medications on file prior to encounter.   No current outpatient prescriptions on file prior to encounter.    FAMILY HISTORY:  Her has no family status information on file.   SOCIAL HISTORY: She  reports that she has quit smoking. She does not have any smokeless tobacco history on file. She reports that she does not drink alcohol or use illicit drugs.  REVIEW OF SYSTEMS:   Cough, wheezing, dyspnea. No sputum production No chest pain, palpitation. No fevers, chills. No nausea, vomiting, diarrhea, consultation. All other review of systems are negative  SUBJECTIVE:    VITAL SIGNS: BP 115/70 mmHg  Pulse 113  Temp(Src) 97.4 F (36.3 C) (Oral)  Resp 22  Ht 5\' 4"  (1.626 m)  Wt 180 lb 12.4 oz (82 kg)  BMI 31.02 kg/m2  SpO2 95%  HEMODYNAMICS:    VENTILATOR SETTINGS:    INTAKE / OUTPUT: I/O last 3 completed shifts: In: 1080 [P.O.:1080] Out: 250 [Urine:250]  PHYSICAL EXAMINATION: General: Elderly female, mild respiratory distress Neuro: Alert, awake, oriented. No gross focal  deficits HEENT:  Moist mucous membranes, no thyromegaly, JVD Cardiovascular:  Regular rate and rhythm, no MRG Lungs:  Reduced air entry, bilateral expiratory wheeze, bilateral rhonchi Abdomen: Soft, positive bowel sounds Skin:  Intact, no rash  LABS:  BMET  Recent Labs Lab 04/21/15 1408 04/22/15 0607 04/23/15 0537  NA 132* 136 137  K 2.9* 4.0 4.5  CL 90* 99* 98*  CO2 29 24 26   BUN 17 18 23*  CREATININE 1.01* 0.86 0.93  GLUCOSE 100* 89 113*    Electrolytes  Recent Labs Lab 04/21/15 1408 04/22/15 0607 04/23/15 0537  CALCIUM 8.3* 8.6* 9.0  MG  --  2.1  --     CBC  Recent Labs Lab 04/21/15 1408 04/22/15 0607 04/23/15 0537  WBC 4.2 2.4* 4.4  HGB 10.5* 10.5* 10.9*  HCT 32.4* 33.7* 35.7*  PLT 167 185 214    Coag's No results for input(s): APTT, INR in the last 168 hours.  Sepsis Markers No results for input(s): LATICACIDVEN, PROCALCITON, O2SATVEN in the last 168 hours.  ABG  Recent Labs Lab 04/23/15 1540  PHART 7.481*  PCO2ART 38.6  PO2ART 78.1*    Liver Enzymes  Recent Labs Lab 04/21/15 1408 04/23/15 0537  AST 33 37  ALT 25 27  ALKPHOS 73 72  BILITOT 0.9 0.5  ALBUMIN 3.4* 3.4*    Cardiac Enzymes  Recent Labs Lab 04/21/15 1408  TROPONINI <0.03    Glucose No results for input(s): GLUCAP in the last 168 hours.  Imaging Dg Chest 1 View  04/23/2015  CLINICAL DATA:  Patient with shortness of breath. EXAM: CHEST 1 VIEW COMPARISON:  Chest radiograph 04/21/2015 FINDINGS: Stable cardiac and mediastinal contours. Interval increase in coarse interstitial opacities right-greater-than-left lung base. Persistent right perihilar opacity. Apical emphysematous change. Left chest wall surgical clips. IMPRESSION: Interval increase in basilar opacities which may represent superimposed infectious process on chronic fibrosis. Grossly unchanged right parahilar soft tissue which may represent post radiation fibrosis. Neoplasm is not excluded. Electronically  Signed   By: Lovey Newcomer M.D.   On: 04/23/2015 16:28     STUDIES:  CTA 2/24 >> no PE, advanced emphysema, bilateral lower lobe infiltrate. Images reviewed Chest x-ray 2/26>> bilateral lower lobe infiltrates  CULTURES: RVP 2/25 Flu PCR 2/24 >> Flu B  ANTIBIOTICS: Doxy 2/26 > Tamiflu 2/25 >  SIGNIFICANT EVENTS: 2/24 Admit with AECOPD, FLuB infection  LINES/TUBES:  DISCUSSION: 76 year old with severe COPD admitted with flu B infection, acute exacerbation. She has not improved in her symptoms. Her O2 requirements have gone up from a baseline of 4 to 6 L nasal cannula. Chest x-ray shows slightly worse opacities today. ABG today is OK.  ASSESSMENT / PLAN: COPD exacerbation. Severe COPD Flu B infection  Reccs: - Agree with transfer to stepdown for closer monitoring. - Continue nebs (standing budesonide, duonebs), daliresp, solumedrol at 60 q12 - On tamiflu and doxy. - Tessalon and tussionex for cough. - Repeat CXR tomorrow.  We will continue to follow.  Marshell Garfinkel MD  Pulmonary and Critical Care Pager [phone] If no answer or after 3pm call: [phone] 04/23/2015, 6:09 PM

## 2015-04-23 NOTE — Progress Notes (Signed)
OT Cancellation Note  Patient Details Name: Pamela Benton MRN: [id number] DOB: 1940-05-03   Cancelled Treatment:     Pt. Nurse stated pt. Was not appropriate today for therapy.  Delonda Coley 04/23/2015, 1:17 PM

## 2015-04-23 NOTE — Progress Notes (Signed)
[facility] Progress Note   Pamela Benton  [id number]  DOB: 08/31/1940  DOA: 04/21/2015 PCP: No primary care provider on file.  Brief narrative:  Pamela Benton is a 75 y.o. female with COPD on 4 L of oxygen at home, history of lung cancer status post lobectomy who presents to the hospital for cough and dyspnea at rest. The patient was treated for pneumonia about a month ago and had improved however, developed a severe cough with shortness of breath at rest and with exertion a couple of days ago. Nebulizer treatments used at home did not help. She feels tightness in her chest. No complaint of fevers. Cough is productive of clear sputum. No sore throat and runny nose or watery eyes. She is noted to be quite short of breath in the ER and has persistent cough and is therefore being admitted.  Subjective: Cough is quite severe and she was unable to rest last night. He is short of breath and continues to wheeze. No complaint of nausea vomiting or pain.  Assessment/Plan: Principal Problem:   COPD exacerbation/ Influenza B - cont nebs, steroids, Dileresp, Tussionex and supplemental O2 - have added Tamiflu, Mucinex, Pulmicort, doxycycline -We'll ask for a pulmonary consult as symptoms have not improved and appear to be getting worse  Active Problems:   Hyponatremia - improved with IV normal salilne and with holding Lasix    Hypokalemia - resolved with replacement  Prolonged QT - avoid medications that will further prolong QT   Antibiotics: Anti-infectives    Start     Dose/Rate Route Frequency Ordered Stop   04/23/15 2200  oseltamivir (TAMIFLU) capsule 30 mg     30 mg Oral 2 times daily 04/23/15 1330 04/27/15 0959   04/23/15 1245  levofloxacin (LEVAQUIN) tablet 500 mg  Status:  Discontinued     500 mg Oral Daily 04/23/15 1244 04/23/15 1255   04/22/15 1000  oseltamivir (TAMIFLU) capsule 75 mg  Status:  Discontinued     75 mg Oral 2 times daily 04/22/15 0744 04/23/15  1330     Code Status:     Code Status Orders        Start     Ordered   04/21/15 1654  Do not attempt resuscitation (DNR)   Continuous    Question Answer Comment  In the event of cardiac or respiratory ARREST Do not call a "code blue"   In the event of cardiac or respiratory ARREST Do not perform Intubation, CPR, defibrillation or ACLS   In the event of cardiac or respiratory ARREST Use medication by any route, position, wound care, and other measures to relive pain and suffering. May use oxygen, suction and manual treatment of airway obstruction as needed for comfort.      04/21/15 1653    Code Status History    Date Active Date Inactive Code Status Order ID Comments User Context   04/21/2015  4:48 PM 04/21/2015  4:53 PM Full Code [id number]  Debbe Odea, MD ED    Advance Directive Documentation        Most Recent Value   Type of Advance Directive  Living will, Healthcare Power of Attorney   Pre-existing out of facility DNR order (yellow form or pink MOST form)     "MOST" Form in Place?       Family Communication:  Disposition Plan: home in 1-2 days DVT prophylaxis: Lovenox Consultants:  Procedures:     Objective: Filed Weights   04/21/15 2150  Weight: 82 kg (180 lb 12.4 oz)    Intake/Output Summary (Last 24 hours) at 04/23/15 1518 Last data filed at 04/22/15 1744  Gross per 24 hour  Intake    360 ml  Output      0 ml  Net    360 ml     Vitals Filed Vitals:   04/22/15 2319 04/23/15 0439 04/23/15 0441 04/23/15 0805  BP:   115/70   Pulse:   113   Temp:   97.4 F (36.3 C)   TempSrc:   Oral   Resp:   22   Height:      Weight:      SpO2: 97% 94% 94% 95%    Exam:  General:  Pt is alert, not in acute distress  HEENT: No icterus, No thrush, oral mucosa moist  Cardiovascular: regular rate and rhythm, S1/S2 No murmur  Respiratory: b/l rhonchi and wheezing- severe cough  Abdomen: Soft, +Bowel sounds, non tender, non distended, no guarding  MSK: No  cyanosis or clubbing- no pedal edema   Data Reviewed: Basic Metabolic Panel:  Recent Labs Lab 04/21/15 1408 04/22/15 0607 04/23/15 0537  NA 132* 136 137  K 2.9* 4.0 4.5  CL 90* 99* 98*  CO2 29 24 26   GLUCOSE 100* 89 113*  BUN 17 18 23*  CREATININE 1.01* 0.86 0.93  CALCIUM 8.3* 8.6* 9.0  MG  --  2.1  --    Liver Function Tests:  Recent Labs Lab 04/21/15 1408 04/23/15 0537  AST 33 37  ALT 25 27  ALKPHOS 73 72  BILITOT 0.9 0.5  PROT 6.4* 6.5  ALBUMIN 3.4* 3.4*   No results for input(s): LIPASE, AMYLASE in the last 168 hours. No results for input(s): AMMONIA in the last 168 hours. CBC:  Recent Labs Lab 04/21/15 1408 04/22/15 0607 04/23/15 0537  WBC 4.2 2.4* 4.4  NEUTROABS 3.8  --   --   HGB 10.5* 10.5* 10.9*  HCT 32.4* 33.7* 35.7*  MCV 82.7 85.5 86.7  PLT 167 185 214   Cardiac Enzymes:  Recent Labs Lab 04/21/15 1408  TROPONINI <0.03   BNP (last 3 results) No results for input(s): BNP in the last 8760 hours.  ProBNP (last 3 results) No results for input(s): PROBNP in the last 8760 hours.  CBG: No results for input(s): GLUCAP in the last 168 hours.  No results found for this or any previous visit (from the past 240 hour(s)).   Studies: Ct Angio Chest Pe W/cm &/or Wo Cm  04/21/2015  CLINICAL DATA:  Respiratory distress in a patient with lung cancer. EXAM: CT ANGIOGRAPHY CHEST WITH CONTRAST TECHNIQUE: Multidetector CT imaging of the chest was performed using the standard protocol during bolus administration of intravenous contrast. Multiplanar CT image reconstructions and MIPs were obtained to evaluate the vascular anatomy. CONTRAST:  131mL OMNIPAQUE IOHEXOL 350 MG/ML SOLN COMPARISON:  None. FINDINGS: Mediastinum / Lymph Nodes: There is no axillary lymphadenopathy. No mediastinal lymphadenopathy. No evidence for left hilar lymphadenopathy. 8 mm short axis lymph node is identified in the right hilum. The heart size is normal. No pericardial effusion.  Coronary artery calcification is noted. No evidence for filling defect in the opacified pulmonary arteries although evaluation of the lower lobes is degraded by respiratory motion. No thoracic aortic aneurysm. No evidence for dissection flap in the thoracic aorta. Lungs / Pleura: Moderate to severe emphysema noted bilaterally. Volume loss in the right hemi thorax is associated with confluent soft tissue attenuation in  the anteromedial right chest which may be related to previous radiation therapy. No evidence for pulmonary edema. No pleural effusion. Upper Abdomen: 1.6 x 2.1 cm cystic lesion identified in the left upper abdomen has been incompletely visualized, but is suspicious for a cystic lesion of the pancreatic tail. MSK / Soft Tissues: Bone windows reveal no worrisome lytic or sclerotic osseous lesions. Review of the MIP images confirms the above findings. IMPRESSION: 1. No CT evidence for acute pulmonary embolus. 2. 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 Comparison to previous imaging studies is recommended to ensure that this is stable as recurrent neoplasm could have this appearance. 3. 2.1 cm cystic lesion in the left upper abdomen has been incompletely visualized but is suspicious for a cystic process in the tail the pancreas. Pancreatic protocol CT or MR could be used to further evaluate. Electronically Signed   By: Misty Stanley M.D.   On: 04/21/2015 16:01    Scheduled Meds:  Scheduled Meds: . albuterol  2.5 mg Nebulization STAT  . [START ON 04/24/2015] ALPRAZolam  0.5 mg Oral Daily  . benzonatate  200 mg Oral TID  . budesonide (PULMICORT) nebulizer solution  0.25 mg Nebulization BID  . chlorpheniramine-HYDROcodone  5 mL Oral Q12H  . citalopram  20 mg Oral Daily  . enoxaparin (LOVENOX) injection  40 mg Subcutaneous Q24H  . guaiFENesin  1,200 mg Oral BID  . ipratropium-albuterol  3 mL  Nebulization Q4H  . memantine  28 mg Oral Daily  . methylPREDNISolone (SOLU-MEDROL) injection  60 mg Intravenous Q12H  . oseltamivir  30 mg Oral BID  . roflumilast  500 mcg Oral Daily   Continuous Infusions:   Time spent on care of this patient: 23 min   Hugo, MD 04/23/2015, 3:18 PM  LOS: 1 day   [facility] Office  [phone] Pager - Text Page per [email or web address] If 7PM-7AM, please contact night-coverage [email or web address]

## 2015-04-24 LAB — RESPIRATORY VIRUS PANEL
ADENOVIRUS: NEGATIVE
INFLUENZA B 1: POSITIVE — AB
Influenza A: NEGATIVE
METAPNEUMOVIRUS: NEGATIVE
PARAINFLUENZA 1 A: NEGATIVE
Parainfluenza 2: NEGATIVE
Parainfluenza 3: NEGATIVE
RESPIRATORY SYNCYTIAL VIRUS A: NEGATIVE
RESPIRATORY SYNCYTIAL VIRUS B: NEGATIVE
RHINOVIRUS: NEGATIVE

## 2015-04-24 LAB — BASIC METABOLIC PANEL
ANION GAP: 11 (ref 5–15)
BUN: 19 mg/dL (ref 6–20)
CHLORIDE: 99 mmol/L — AB (ref 101–111)
CO2: 27 mmol/L (ref 22–32)
Calcium: 8.7 mg/dL — ABNORMAL LOW (ref 8.9–10.3)
Creatinine, Ser: 0.82 mg/dL (ref 0.44–1.00)
GFR calc Af Amer: >60 mL/min (ref >60)
GLUCOSE: 118 mg/dL — AB (ref 65–99)
POTASSIUM: 4.7 mmol/L (ref 3.5–5.1)
Sodium: 137 mmol/L (ref 135–145)

## 2015-04-24 MED ORDER — OSELTAMIVIR PHOSPHATE 75 MG PO CAPS
75.0000 mg | ORAL_CAPSULE | Freq: Two times a day (BID) | ORAL | Status: AC
  Administered 2015-04-24 – 2015-04-26 (×6): 75 mg via ORAL
  Filled 2015-04-24 (×7): qty 1

## 2015-04-24 MED ORDER — PANTOPRAZOLE SODIUM 40 MG PO TBEC
40.0000 mg | DELAYED_RELEASE_TABLET | Freq: Two times a day (BID) | ORAL | Status: DC
  Administered 2015-04-24 – 2015-04-29 (×10): 40 mg via ORAL
  Filled 2015-04-24 (×10): qty 1

## 2015-04-24 MED ORDER — IPRATROPIUM-ALBUTEROL 0.5-2.5 (3) MG/3ML IN SOLN
3.0000 mL | Freq: Four times a day (QID) | RESPIRATORY_TRACT | Status: DC
  Administered 2015-04-24: 3 mL via RESPIRATORY_TRACT
  Filled 2015-04-24: qty 3

## 2015-04-24 MED ORDER — IPRATROPIUM-ALBUTEROL 0.5-2.5 (3) MG/3ML IN SOLN
3.0000 mL | RESPIRATORY_TRACT | Status: DC
Start: 2015-04-24 — End: 2015-04-28
  Administered 2015-04-24 – 2015-04-27 (×19): 3 mL via RESPIRATORY_TRACT
  Filled 2015-04-24 (×20): qty 3

## 2015-04-24 MED ORDER — FLUTICASONE PROPIONATE 50 MCG/ACT NA SUSP
2.0000 | Freq: Two times a day (BID) | NASAL | Status: DC
  Administered 2015-04-24 – 2015-04-29 (×11): 2 via NASAL
  Filled 2015-04-24: qty 16

## 2015-04-24 NOTE — Clinical Documentation Improvement (Signed)
Internal Medicine Critical Care  Can the diagnosis of Hypoxia be further specified? Please document findings in next progress note NOT in BPA drop down box. Thanks!   Document Acuity - Acute, Chronic, Acute on Chronic  Document Inclusion Of - Hypoxia, Hypercapnia, Combination of Both  Other  Clinically Undetermined  Document any associated diagnoses/conditions.  Supporting Information:  History of COPD; is oxygen dependent at home  75 year old with severe COPD admitted with flu B infection, acute exacerbation.  She has not improved in her symptoms. Her O2 requirements have gone up from a baseline of 4 to 6 L nasal cannula. Chest x-ray shows slightly worse opacities today.   Please exercise your independent, professional judgment when responding. A specific answer is not anticipated or expected.  Thank You,  Zoila Shutter RN, BSN, [place] [phone]; Cell: [phone]

## 2015-04-24 NOTE — Progress Notes (Signed)
PULMONARY / CRITICAL CARE MEDICINE   Name: Pamela Benton MRN: [id number] DOB: 28-Aug-1940    ADMISSION DATE:  04/21/2015 CONSULTATION DATE:  04/23/15  REFERRING MD:  Reggy Eye  CHIEF COMPLAINT:  COPD exacerbation, Influenza infection.   SUBJECTIVE:  "no better"  VITAL SIGNS: BP 102/56 mmHg  Pulse 113  Temp(Src) 97.3 F (36.3 C) (Oral)  Resp 23  Ht 5\' 4"  (1.626 m)  Wt 189 lb 2.5 oz (85.8 kg)  BMI 32.45 kg/m2  SpO2 99%  HEMODYNAMICS:    VENTILATOR SETTINGS:    INTAKE / OUTPUT: I/O last 3 completed shifts: In: 940 [P.O.:940] Out: 300 [Urine:300]  PHYSICAL EXAMINATION: General: Elderly female, mild respiratory distress Neuro: Alert, awake, oriented. No gross focal deficits HEENT:  Moist mucous membranes, no thyromegaly, JVD, marked upper airway wheeze  Cardiovascular:  Regular rate and rhythm, no MRG Lungs:  Reduced air entry, bilateral expiratory wheeze, bilateral rhonchi Abdomen: Soft, positive bowel sounds Skin:  Intact, no rash  LABS:  BMET  Recent Labs Lab 04/22/15 0607 04/23/15 0537 04/24/15 0348  NA 136 137 137  K 4.0 4.5 4.7  CL 99* 98* 99*  CO2 24 26 27   BUN 18 23* 19  CREATININE 0.86 0.93 0.82  GLUCOSE 89 113* 118*    Electrolytes  Recent Labs Lab 04/22/15 0607 04/23/15 0537 04/24/15 0348  CALCIUM 8.6* 9.0 8.7*  MG 2.1  --   --     CBC  Recent Labs Lab 04/21/15 1408 04/22/15 0607 04/23/15 0537  WBC 4.2 2.4* 4.4  HGB 10.5* 10.5* 10.9*  HCT 32.4* 33.7* 35.7*  PLT 167 185 214    Coag's No results for input(s): APTT, INR in the last 168 hours.  Sepsis Markers No results for input(s): LATICACIDVEN, PROCALCITON, O2SATVEN in the last 168 hours.  ABG  Recent Labs Lab 04/23/15 1540  PHART 7.481*  PCO2ART 38.6  PO2ART 78.1*    Liver Enzymes  Recent Labs Lab 04/21/15 1408 04/23/15 0537  AST 33 37  ALT 25 27  ALKPHOS 73 72  BILITOT 0.9 0.5  ALBUMIN 3.4* 3.4*    Cardiac Enzymes  Recent Labs Lab  04/21/15 1408  TROPONINI <0.03    Glucose No results for input(s): GLUCAP in the last 168 hours.  Imaging Dg Chest 1 View  04/23/2015  CLINICAL DATA:  Patient with shortness of breath. EXAM: CHEST 1 VIEW COMPARISON:  Chest radiograph 04/21/2015 FINDINGS: Stable cardiac and mediastinal contours. Interval increase in coarse interstitial opacities right-greater-than-left lung base. Persistent right perihilar opacity. Apical emphysematous change. Left chest wall surgical clips. IMPRESSION: Interval increase in basilar opacities which may represent superimposed infectious process on chronic fibrosis. Grossly unchanged right parahilar soft tissue which may represent post radiation fibrosis. Neoplasm is not excluded. Electronically Signed   By: Lovey Newcomer M.D.   On: 04/23/2015 16:28     STUDIES:  CTA 2/24 >> no PE, advanced emphysema, bilateral lower lobe infiltrate. Images reviewed Chest x-ray 2/26>> bilateral lower lobe infiltrates  CULTURES: RVP 2/25 Flu PCR 2/24 >> Flu B  ANTIBIOTICS: Doxy 2/26 > Tamiflu 2/25 >  SIGNIFICANT EVENTS: 2/24 Admit with AECOPD, FLuB infection  LINES/TUBES:  DISCUSSION: 75 year old with severe COPD admitted with flu B infection, acute exacerbation. She has not improved in her symptoms. Her O2 requirements have remained the same as 2/26. There appears to be a superimposed Upper airway component   ASSESSMENT / PLAN: COPD exacerbation. Severe COPD Flu B infection  Recs: - cont SDU level monitoring.   -  add flutter  - Continue nebs (standing budesonide, duonebs), daliresp, solumedrol at 60 q12 - add lasix X 1 today (2/27) - add nasal hygiene & humidify O2 - add prophylactic PPI - On tamiflu and doxy. - Tessalon and tussionex for cough. - Repeat CXR PRN   We will continue to follow.  Erick Colace ACNP-BC [place] Pager # [phone] OR # [phone] if no answer  04/24/2015, 10:46 AM

## 2015-04-24 NOTE — Progress Notes (Addendum)
[facility] Progress Note   NAADIRA Benton  [id number]  DOB: 29-Nov-1940  DOA: 04/21/2015 PCP: No primary care provider on file.  Brief narrative:  Pamela Benton is a 75 y.o. female with COPD on 4 L of oxygen at home, history of lung cancer status post lobectomy who presents to the hospital for cough and dyspnea at rest. The patient was treated for pneumonia about a month ago and had improved however, developed a severe cough with shortness of breath at rest and with exertion a couple of days ago. Nebulizer treatments used at home did not help. She feels tightness in her chest. No complaint of fevers. Cough is productive of clear sputum. No sore throat and runny nose or watery eyes. She is noted to be quite short of breath in the ER and has persistent cough and is therefore being admitted.  Subjective: Still has cough, dyspnea and dyspnea with minimal exertion. No fever, chills, nausea, vomiting, pain or diarrhea.   Assessment/Plan: Principal Problem:   COPD exacerbation/ Influenza B- acute respiratory failure - cont nebs, steroids, Dileresp, Tussionex and supplemental O2- using about 5-6 L O2- on 4 L at baseline - have added Tamiflu, Mucinex, Pulmicort, doxycycline - appreciate pulm consult  Active Problems:   Hyponatremia - improved with IV normal salilne and with holding Lasix    Hypokalemia - resolved with replacement  Prolonged QT - avoid medications that will further prolong QT   Antibiotics: Anti-infectives    Start     Dose/Rate Route Frequency Ordered Stop   04/24/15 1000  oseltamivir (TAMIFLU) capsule 75 mg     75 mg Oral 2 times daily 04/24/15 0808 04/27/15 0959   04/23/15 2200  oseltamivir (TAMIFLU) capsule 30 mg  Status:  Discontinued     30 mg Oral 2 times daily 04/23/15 1330 04/24/15 0808   04/23/15 1600  doxycycline (VIBRAMYCIN) 100 mg in dextrose 5 % 250 mL IVPB     100 mg 125 mL/hr over 120 Minutes Intravenous Every 12 hours 04/23/15 1519      04/23/15 1245  levofloxacin (LEVAQUIN) tablet 500 mg  Status:  Discontinued     500 mg Oral Daily 04/23/15 1244 04/23/15 1255   04/22/15 1000  oseltamivir (TAMIFLU) capsule 75 mg  Status:  Discontinued     75 mg Oral 2 times daily 04/22/15 0744 04/23/15 1330     Code Status:     Code Status Orders        Start     Ordered   04/21/15 1654  Do not attempt resuscitation (DNR)   Continuous    Question Answer Comment  In the event of cardiac or respiratory ARREST Do not call a "code blue"   In the event of cardiac or respiratory ARREST Do not perform Intubation, CPR, defibrillation or ACLS   In the event of cardiac or respiratory ARREST Use medication by any route, position, wound care, and other measures to relive pain and suffering. May use oxygen, suction and manual treatment of airway obstruction as needed for comfort.      04/21/15 1653    Code Status History    Date Active Date Inactive Code Status Order ID Comments User Context   04/21/2015  4:48 PM 04/21/2015  4:53 PM Full Code [id number]  Debbe Odea, MD ED    Advance Directive Documentation        Most Recent Value   Type of Advance Directive  Living will, Healthcare Power of D[place]ray  Pre-existing out of facility DNR order (yellow form or pink MOST form)     "MOST" Form in Place?       Family Communication:  Disposition Plan: cont to follow in SDU DVT prophylaxis: Lovenox Consultants: PCCM Procedures:     Objective: Filed Weights   04/21/15 2150 04/23/15 1906  Weight: 82 kg (180 lb 12.4 oz) 85.8 kg (189 lb 2.5 oz)    Intake/Output Summary (Last 24 hours) at 04/24/15 1206 Last data filed at 04/24/15 1109  Gross per 24 hour  Intake   1440 ml  Output   1100 ml  Net    340 ml     Vitals Filed Vitals:   04/24/15 0739 04/24/15 0800 04/24/15 0900 04/24/15 1109  BP: 120/60 105/56 102/56   Pulse: 101 105 113   Temp: 97.3 F (36.3 C)   97.3 F (36.3 C)  TempSrc: Oral   Oral  Resp: 16 22 23    Height:       Weight:      SpO2: 97% 98% 99%     Exam:  General:  Pt is alert, not in acute distress  HEENT: No icterus, No thrush, oral mucosa moist  Cardiovascular: regular rate and rhythm, S1/S2 No murmur  Respiratory: b/l rhonchi and wheezing- severe cough  Abdomen: Soft, +Bowel sounds, non tender, non distended, no guarding  MSK: No cyanosis or clubbing- no pedal edema   Data Reviewed: Basic Metabolic Panel:  Recent Labs Lab 04/21/15 1408 04/22/15 0607 04/23/15 0537 04/24/15 0348  NA 132* 136 137 137  K 2.9* 4.0 4.5 4.7  CL 90* 99* 98* 99*  CO2 29 24 26 27   GLUCOSE 100* 89 113* 118*  BUN 17 18 23* 19  CREATININE 1.01* 0.86 0.93 0.82  CALCIUM 8.3* 8.6* 9.0 8.7*  MG  --  2.1  --   --    Liver Function Tests:  Recent Labs Lab 04/21/15 1408 04/23/15 0537  AST 33 37  ALT 25 27  ALKPHOS 73 72  BILITOT 0.9 0.5  PROT 6.4* 6.5  ALBUMIN 3.4* 3.4*   No results for input(s): LIPASE, AMYLASE in the last 168 hours. No results for input(s): AMMONIA in the last 168 hours. CBC:  Recent Labs Lab 04/21/15 1408 04/22/15 0607 04/23/15 0537  WBC 4.2 2.4* 4.4  NEUTROABS 3.8  --   --   HGB 10.5* 10.5* 10.9*  HCT 32.4* 33.7* 35.7*  MCV 82.7 85.5 86.7  PLT 167 185 214   Cardiac Enzymes:  Recent Labs Lab 04/21/15 1408  TROPONINI <0.03   BNP (last 3 results)  Recent Labs  04/23/15 1657  BNP 36.0    ProBNP (last 3 results) No results for input(s): PROBNP in the last 8760 hours.  CBG: No results for input(s): GLUCAP in the last 168 hours.  Recent Results (from the past 240 hour(s))  MRSA PCR Screening     Status: None   Collection Time: 04/23/15  7:41 PM  Result Value Ref Range Status   MRSA by PCR NEGATIVE NEGATIVE Final    Comment:        The GeneXpert MRSA Assay (FDA approved for NASAL specimens only), is one component of a comprehensive MRSA colonization surveillance program. It is not intended to diagnose MRSA infection nor to guide or monitor  treatment for MRSA infections.      Studies: Dg Chest 1 View  04/23/2015  CLINICAL DATA:  Patient with shortness of breath. EXAM: CHEST 1 VIEW COMPARISON:  Chest  radiograph 04/21/2015 FINDINGS: Stable cardiac and mediastinal contours. Interval increase in coarse interstitial opacities right-greater-than-left lung base. Persistent right perihilar opacity. Apical emphysematous change. Left chest wall surgical clips. IMPRESSION: Interval increase in basilar opacities which may represent superimposed infectious process on chronic fibrosis. Grossly unchanged right parahilar soft tissue which may represent post radiation fibrosis. Neoplasm is not excluded. Electronically Signed   By: Lovey Newcomer M.D.   On: 04/23/2015 16:28    Scheduled Meds:  Scheduled Meds: . albuterol  2.5 mg Nebulization STAT  . ALPRAZolam  0.5 mg Oral Daily  . benzonatate  200 mg Oral TID  . budesonide (PULMICORT) nebulizer solution  0.25 mg Nebulization BID  . chlorpheniramine-HYDROcodone  5 mL Oral Q12H  . citalopram  20 mg Oral Daily  . doxycycline (VIBRAMYCIN) IV  100 mg Intravenous Q12H  . enoxaparin (LOVENOX) injection  40 mg Subcutaneous Q24H  . fluticasone  2 spray Each Nare BID  . guaiFENesin  1,200 mg Oral BID  . ipratropium-albuterol  3 mL Nebulization Q6H  . memantine  28 mg Oral Daily  . methylPREDNISolone (SOLU-MEDROL) injection  60 mg Intravenous Q12H  . oseltamivir  75 mg Oral BID  . pantoprazole  40 mg Oral BID AC  . roflumilast  500 mcg Oral Daily   Continuous Infusions:   Time spent on care of this patient: 77 min   [place], MD 04/24/2015, 12:06 PM  LOS: 2 days   [facility] Office  [phone] Pager - Text Page per [email or web address] If 7PM-7AM, please contact night-coverage [email or web address]

## 2015-04-24 NOTE — Progress Notes (Signed)
PT Cancellation Note  Patient Details Name: Pamela Benton MRN: [id number] DOB: [birth year]   Cancelled Treatment:    Reason Eval/Treat Not Completed: Medical issues which prohibited therapy (moved to SDU, SOB.)   [name], [name] 04/24/2015, 2:38 PM Tresa Endo PT [phone]

## 2015-04-24 NOTE — Progress Notes (Signed)
Date: April 24, 2015 Chart reviewed for concurrent status and case management needs. Will continue to follow patient for changes and needs: Velva Harman, BSN, [place], [place]   [phone]

## 2015-04-24 NOTE — Progress Notes (Signed)
OT Cancellation Note  Patient Details Name: ICA HOLFORD MRN: [id number] DOB: 06-30-1940   Cancelled Treatment:    Reason Eval/Treat Not Completed: Medical issues which prohibited therapy  Spoke with PT and pt is too SOB. Will recheck on pt next day as schedule allows.    Betsy Pries 04/24/2015, 2:38 PM

## 2015-04-25 DIAGNOSIS — J9620 Acute and chronic respiratory failure, unspecified whether with hypoxia or hypercapnia: Secondary | ICD-10-CM

## 2015-04-25 LAB — MAGNESIUM: Magnesium: 2.2 mg/dL (ref 1.7–2.4)

## 2015-04-25 LAB — BASIC METABOLIC PANEL
Anion gap: 10 (ref 5–15)
BUN: 23 mg/dL — AB (ref 6–20)
CHLORIDE: 102 mmol/L (ref 101–111)
CO2: 28 mmol/L (ref 22–32)
CREATININE: 0.81 mg/dL (ref 0.44–1.00)
Calcium: 8.6 mg/dL — ABNORMAL LOW (ref 8.9–10.3)
GFR calc non Af Amer: >60 mL/min (ref >60)
Glucose, Bld: 126 mg/dL — ABNORMAL HIGH (ref 65–99)
Potassium: 4.2 mmol/L (ref 3.5–5.1)
Sodium: 140 mmol/L (ref 135–145)

## 2015-04-25 LAB — PHOSPHORUS: Phosphorus: 3.4 mg/dL (ref 2.5–4.6)

## 2015-04-25 MED ORDER — MORPHINE SULFATE (PF) 4 MG/ML IV SOLN
3.0000 mg | Freq: Once | INTRAVENOUS | Status: AC
  Administered 2015-04-25: 3 mg via INTRAVENOUS
  Filled 2015-04-25: qty 1

## 2015-04-25 MED ORDER — MORPHINE SULFATE (CONCENTRATE) 10 MG/0.5ML PO SOLN
5.0000 mg | ORAL | Status: DC | PRN
  Administered 2015-04-25 – 2015-04-28 (×9): 5 mg via SUBLINGUAL
  Filled 2015-04-25 (×9): qty 0.5

## 2015-04-25 NOTE — Plan of Care (Addendum)
Problem: Respiratory: Goal: Levels of oxygenation will improve Outcome: Not Progressing Pt unable to even lift hips to position on bed pan without significant increased work of breathing and O2 sats down to mid 80s.

## 2015-04-25 NOTE — Progress Notes (Signed)
[facility] Progress Note   Pamela Benton  [id number]  DOB: 06-Jan-1941  DOA: 04/21/2015 PCP: No primary care provider on file.  Brief narrative:  Pamela Benton is a 75 y.o. female with COPD on 4 L of oxygen at home, history of lung cancer status post lobectomy who presents to the hospital for cough and dyspnea at rest. The patient was treated for pneumonia about a month ago and had improved however, developed a severe cough with shortness of breath at rest and with exertion a couple of days ago. Nebulizer treatments used at home did not help. She feels tightness in her chest. No complaint of fevers. Cough is productive of clear sputum. No sore throat and runny nose or watery eyes. She is noted to be quite short of breath in the ER and has persistent cough and is therefore being admitted.  Subjective: No improvement in cough, dyspnea and dyspnea with minimal exertion. No fever, chills, nausea, vomiting, pain or diarrhea.   Assessment/Plan: Principal Problem:   COPD exacerbation/ Influenza B- acute respiratory failure - cont nebs, steroids, Dileresp, Tussionex and supplemental O2- using about 5-6 L O2- on 4 L at baseline - have added Tamiflu, Mucinex, Pulmicort, doxycycline - still quite dyspneic - appreciate pulm consult- pulm starting CPAP and low dose Morphine today   Active Problems:   Hyponatremia - improved with IV normal salilne and with holding Lasix    Hypokalemia - resolved with replacement  Prolonged QT - avoid medications that will further prolong QT   Antibiotics: Anti-infectives    Start     Dose/Rate Route Frequency Ordered Stop   04/24/15 1000  oseltamivir (TAMIFLU) capsule 75 mg     75 mg Oral 2 times daily 04/24/15 0808 04/27/15 0959   04/23/15 2200  oseltamivir (TAMIFLU) capsule 30 mg  Status:  Discontinued     30 mg Oral 2 times daily 04/23/15 1330 04/24/15 0808   04/23/15 1600  doxycycline (VIBRAMYCIN) 100 mg in dextrose 5 % 250 mL IVPB      100 mg 125 mL/hr over 120 Minutes Intravenous Every 12 hours 04/23/15 1519     04/23/15 1245  levofloxacin (LEVAQUIN) tablet 500 mg  Status:  Discontinued     500 mg Oral Daily 04/23/15 1244 04/23/15 1255   04/22/15 1000  oseltamivir (TAMIFLU) capsule 75 mg  Status:  Discontinued     75 mg Oral 2 times daily 04/22/15 0744 04/23/15 1330     Code Status:     Code Status Orders        Start     Ordered   04/21/15 1654  Do not attempt resuscitation (DNR)   Continuous    Question Answer Comment  In the event of cardiac or respiratory ARREST Do not call a "code blue"   In the event of cardiac or respiratory ARREST Do not perform Intubation, CPR, defibrillation or ACLS   In the event of cardiac or respiratory ARREST Use medication by any route, position, wound care, and other measures to relive pain and suffering. May use oxygen, suction and manual treatment of airway obstruction as needed for comfort.      04/21/15 1653    Code Status History    Date Active Date Inactive Code Status Order ID Comments User Context   04/21/2015  4:48 PM 04/21/2015  4:53 PM Full Code [id number]  Debbe Odea, MD ED    Advance Directive Documentation        Most Recent Value  Type of Advance Directive  Living will, Healthcare Power of Attorney   Pre-existing out of facility DNR order (yellow form or pink MOST form)     "MOST" Form in Place?       Family Communication:  Disposition Plan: cont to follow in SDU DVT prophylaxis: Lovenox Consultants: PCCM Procedures:     Objective: Filed Weights   04/21/15 2150 04/23/15 1906 04/25/15 0400  Weight: 82 kg (180 lb 12.4 oz) 85.8 kg (189 lb 2.5 oz) 87.9 kg (193 lb 12.6 oz)    Intake/Output Summary (Last 24 hours) at 04/25/15 1342 Last data filed at 04/25/15 1200  Gross per 24 hour  Intake   1440 ml  Output   1150 ml  Net    290 ml     Vitals Filed Vitals:   04/25/15 0747 04/25/15 0845 04/25/15 1200 04/25/15 1300  BP:      Pulse: 110 110     Temp: 97.6 F (36.4 C)  97.5 F (36.4 C)   TempSrc: Oral     Resp: 20 17    Height:      Weight:      SpO2: 95% 95%  94%    Exam:  General:  Pt is alert, not in acute distress  HEENT: No icterus, No thrush, oral mucosa moist  Cardiovascular: regular rate and rhythm, S1/S2 No murmur  Respiratory: b/l rhonchi and wheezing   Abdomen: Soft, +Bowel sounds, non tender, non distended, no guarding  MSK: No cyanosis or clubbing- no pedal edema   Data Reviewed: Basic Metabolic Panel:  Recent Labs Lab 04/21/15 1408 04/22/15 0607 04/23/15 0537 04/24/15 0348 04/25/15 0314  NA 132* 136 137 137 140  K 2.9* 4.0 4.5 4.7 4.2  CL 90* 99* 98* 99* 102  CO2 29 24 26 27 28   GLUCOSE 100* 89 113* 118* 126*  BUN 17 18 23* 19 23*  CREATININE 1.01* 0.86 0.93 0.82 0.81  CALCIUM 8.3* 8.6* 9.0 8.7* 8.6*  MG  --  2.1  --   --  2.2  PHOS  --   --   --   --  3.4   Liver Function Tests:  Recent Labs Lab 04/21/15 1408 04/23/15 0537  AST 33 37  ALT 25 27  ALKPHOS 73 72  BILITOT 0.9 0.5  PROT 6.4* 6.5  ALBUMIN 3.4* 3.4*   No results for input(s): LIPASE, AMYLASE in the last 168 hours. No results for input(s): AMMONIA in the last 168 hours. CBC:  Recent Labs Lab 04/21/15 1408 04/22/15 0607 04/23/15 0537  WBC 4.2 2.4* 4.4  NEUTROABS 3.8  --   --   HGB 10.5* 10.5* 10.9*  HCT 32.4* 33.7* 35.7*  MCV 82.7 85.5 86.7  PLT 167 185 214   Cardiac Enzymes:  Recent Labs Lab 04/21/15 1408  TROPONINI <0.03   BNP (last 3 results)  Recent Labs  04/23/15 1657  BNP 36.0    ProBNP (last 3 results) No results for input(s): PROBNP in the last 8760 hours.  CBG: No results for input(s): GLUCAP in the last 168 hours.  Recent Results (from the past 240 hour(s))  Respiratory virus panel     Status: Abnormal   Collection Time: 04/22/15  1:44 AM  Result Value Ref Range Status   Respiratory Syncytial Virus A Negative Negative Final   Respiratory Syncytial Virus B Negative Negative  Final   Influenza A Negative Negative Final   Influenza B Positive (A) Negative Final   Parainfluenza 1 Negative  Negative Final   Parainfluenza 2 Negative Negative Final   Parainfluenza 3 Negative Negative Final   Metapneumovirus Negative Negative Final   Rhinovirus Negative Negative Final   Adenovirus Negative Negative Final    Comment: (NOTE) Performed At: [facility] [address] [place], [place] [id number] Lindon Romp MD [id number]   MRSA PCR Screening     Status: None   Collection Time: 04/23/15  7:41 PM  Result Value Ref Range Status   MRSA by PCR NEGATIVE NEGATIVE Final    Comment:        The GeneXpert MRSA Assay (FDA approved for NASAL specimens only), is one component of a comprehensive MRSA colonization surveillance program. It is not intended to diagnose MRSA infection nor to guide or monitor treatment for MRSA infections.      Studies: Dg Chest 1 View  04/23/2015  CLINICAL DATA:  Patient with shortness of breath. EXAM: CHEST 1 VIEW COMPARISON:  Chest radiograph 04/21/2015 FINDINGS: Stable cardiac and mediastinal contours. Interval increase in coarse interstitial opacities right-greater-than-left lung base. Persistent right perihilar opacity. Apical emphysematous change. Left chest wall surgical clips. IMPRESSION: Interval increase in basilar opacities which may represent superimposed infectious process on chronic fibrosis. Grossly unchanged right parahilar soft tissue which may represent post radiation fibrosis. Neoplasm is not excluded. Electronically Signed   By: Lovey Newcomer M.D.   On: 04/23/2015 16:28    Scheduled Meds:  Scheduled Meds: . ALPRAZolam  0.5 mg Oral Daily  . benzonatate  200 mg Oral TID  . budesonide (PULMICORT) nebulizer solution  0.25 mg Nebulization BID  . chlorpheniramine-HYDROcodone  5 mL Oral Q12H  . citalopram  20 mg Oral Daily  . doxycycline (VIBRAMYCIN) IV  100 mg Intravenous Q12H  . enoxaparin (LOVENOX) injection   40 mg Subcutaneous Q24H  . fluticasone  2 spray Each Nare BID  . guaiFENesin  1,200 mg Oral BID  . ipratropium-albuterol  3 mL Nebulization Q4H  . memantine  28 mg Oral Daily  . methylPREDNISolone (SOLU-MEDROL) injection  60 mg Intravenous Q12H  . oseltamivir  75 mg Oral BID  . pantoprazole  40 mg Oral BID AC   Continuous Infusions:   Time spent on care of this patient: 51 min   [place], MD 04/25/2015, 1:42 PM  LOS: 3 days   [facility] Office  [phone] Pager - Text Page per [email or web address] If 7PM-7AM, please contact night-coverage [email or web address]

## 2015-04-25 NOTE — Progress Notes (Signed)
Right forearm IV infiltrated, removed IV and attempted IV start on left forearm. Unsuccessful, consult for IV team put in.

## 2015-04-25 NOTE — Progress Notes (Signed)
PULMONARY / CRITICAL CARE MEDICINE   Name: Pamela Benton MRN: [id number] DOB: [birth year]    ADMISSION DATE:  04/21/2015 CONSULTATION DATE:  04/23/15  REFERRING MD:  Reggy Eye  CHIEF COMPLAINT:  COPD exacerbation, Influenza infection.   SUBJECTIVE:  "no better"  VITAL SIGNS: BP 117/64 mmHg  Pulse 110  Temp(Src) 97.6 F (36.4 C) (Oral)  Resp 17  Ht 5\' 4"  (1.626 m)  Wt 193 lb 12.6 oz (87.9 kg)  BMI 33.25 kg/m2  SpO2 95%  HEMODYNAMICS:    VENTILATOR SETTINGS: Vent Mode:  [-]  FiO2 (%):  [40 %] 40 %  INTAKE / OUTPUT: I/O last 3 completed shifts: In: 2760 [P.O.:1740; Other:20; IV Piggyback:1000] Out: 1950 [id number]  PHYSICAL EXAMINATION: General: Elderly female, mild respiratory distress, a little anxious  Neuro: Alert, awake, oriented. No gross focal deficits HEENT:  Moist mucous membranes, no thyromegaly, JVD, marked upper airway wheeze not improved  Cardiovascular:  Regular rate and rhythm, no MRG Lungs:  Reduced air entry, bilateral expiratory wheeze, bilateral rhonchi Abdomen: Soft, positive bowel sounds Skin:  Intact, no rash  LABS:  BMET  Recent Labs Lab 04/23/15 0537 04/24/15 0348 04/25/15 0314  NA 137 137 140  K 4.5 4.7 4.2  CL 98* 99* 102  CO2 26 27 28   BUN 23* 19 23*  CREATININE 0.93 0.82 0.81  GLUCOSE 113* 118* 126*    Electrolytes  Recent Labs Lab 04/22/15 0607 04/23/15 0537 04/24/15 0348 04/25/15 0314  CALCIUM 8.6* 9.0 8.7* 8.6*  MG 2.1  --   --  2.2  PHOS  --   --   --  3.4    CBC  Recent Labs Lab 04/21/15 1408 04/22/15 0607 04/23/15 0537  WBC 4.2 2.4* 4.4  HGB 10.5* 10.5* 10.9*  HCT 32.4* 33.7* 35.7*  PLT 167 185 214    Coag's No results for input(s): APTT, INR in the last 168 hours.  Sepsis Markers No results for input(s): LATICACIDVEN, PROCALCITON, O2SATVEN in the last 168 hours.  ABG  Recent Labs Lab 04/23/15 1540  PHART 7.481*  PCO2ART 38.6  PO2ART 78.1*    Liver Enzymes  Recent Labs Lab  04/21/15 1408 04/23/15 0537  AST 33 37  ALT 25 27  ALKPHOS 73 72  BILITOT 0.9 0.5  ALBUMIN 3.4* 3.4*    Cardiac Enzymes  Recent Labs Lab 04/21/15 1408  TROPONINI <0.03    Glucose No results for input(s): GLUCAP in the last 168 hours.  Imaging No results found.   STUDIES:  CTA 2/24 >> no PE, advanced emphysema, bilateral lower lobe infiltrate. Images reviewed Chest x-ray 2/26>> bilateral lower lobe infiltrates  CULTURES: RVP 2/25 Flu PCR 2/24 >> Flu B  ANTIBIOTICS: Doxy 2/26 > Tamiflu 2/25 >  SIGNIFICANT EVENTS: 2/24 Admit with AECOPD, FLuB infection  LINES/TUBES:  DISCUSSION: 75 year old with severe COPD admitted with flu B infection, acute exacerbation. She has not improved in her symptoms. Her O2 requirements have remained the same as 2/26. There appears to be a superimposed Upper airway component -->this has not resolved yet and she has sig symptom burden. Will try CPAP as needed and PRN SL morphine to see if this helps her rest and feel better.   ASSESSMENT / PLAN: COPD exacerbation. Severe COPD Flu B infection  Recs: - cont SDU level monitoring.   - added flutter  - Continue nebs (standing budesonide, duonebs), daliresp, solumedrol at 60 q12 - added nasal hygiene & humidify O2 - added prophylactic PPI - On tamiflu and  doxy. - Tessalon and tussionex for cough. - Repeat CXR PRN  - add PRN SL morphine and CPAP  We will continue to follow.  Pamela Benton ACNP-BC [place] Pager # [phone] OR # [phone] if no answer  04/25/2015, 9:59 AM

## 2015-04-25 NOTE — Progress Notes (Signed)
RN asked RT if morning breathing treatments had been administered to patient; at this time patient had already received duoneb and pulmicort. Patient was still complaining of SOB and was more labored than previous day. Salvadore Dom, NP was asked to look at patient by RT and suggested CPAP. Patient is currently on CPAP via V60 with a pressure of +10 and 40%. Patient tolerating well and states she is more comfortable and breathing has improved with CPAP assistance.  RT will continue to monitor patient and notify NP or MD of any increased WOB on CPAP or patient becomes distressed.

## 2015-04-26 DIAGNOSIS — J111 Influenza due to unidentified influenza virus with other respiratory manifestations: Secondary | ICD-10-CM

## 2015-04-26 DIAGNOSIS — J9620 Acute and chronic respiratory failure, unspecified whether with hypoxia or hypercapnia: Secondary | ICD-10-CM

## 2015-04-26 LAB — CBC
HCT: 33.8 % — ABNORMAL LOW (ref 36.0–46.0)
Hemoglobin: 10.4 g/dL — ABNORMAL LOW (ref 12.0–15.0)
MCH: 26.5 pg (ref 26.0–34.0)
MCHC: 30.8 g/dL (ref 30.0–36.0)
MCV: 86.2 fL (ref 78.0–100.0)
PLATELETS: 203 10*3/uL (ref 150–400)
RBC: 3.92 MIL/uL (ref 3.87–5.11)
RDW: 16.9 % — ABNORMAL HIGH (ref 11.5–15.5)
WBC: 5.4 10*3/uL (ref 4.0–10.5)

## 2015-04-26 LAB — BASIC METABOLIC PANEL
Anion gap: 8 (ref 5–15)
BUN: 25 mg/dL — ABNORMAL HIGH (ref 6–20)
CALCIUM: 8.8 mg/dL — AB (ref 8.9–10.3)
CO2: 30 mmol/L (ref 22–32)
CREATININE: 0.87 mg/dL (ref 0.44–1.00)
Chloride: 100 mmol/L — ABNORMAL LOW (ref 101–111)
Glucose, Bld: 110 mg/dL — ABNORMAL HIGH (ref 65–99)
Potassium: 4.6 mmol/L (ref 3.5–5.1)
SODIUM: 138 mmol/L (ref 135–145)

## 2015-04-26 LAB — PHOSPHORUS: PHOSPHORUS: 3.9 mg/dL (ref 2.5–4.6)

## 2015-04-26 LAB — MAGNESIUM: MAGNESIUM: 2.4 mg/dL (ref 1.7–2.4)

## 2015-04-26 MED ORDER — MORPHINE SULFATE (PF) 2 MG/ML IV SOLN
2.0000 mg | Freq: Once | INTRAVENOUS | Status: AC
  Administered 2015-04-26: 2 mg via INTRAVENOUS
  Filled 2015-04-26: qty 1

## 2015-04-26 NOTE — Progress Notes (Signed)
Date:  April 26, 2015 Chart reviewed for concurrent status and case management needs. Will continue to follow patient for changes and needs: Velva Harman, BSN, [place], [place]   [phone]

## 2015-04-26 NOTE — Progress Notes (Signed)
PULMONARY / CRITICAL CARE MEDICINE   Name: Pamela Benton MRN: [id number] DOB: [birth year]    ADMISSION DATE:  04/21/2015 CONSULTATION DATE:  04/23/15  REFERRING MD:  Reggy Eye  CHIEF COMPLAINT:  COPD exacerbation, Influenza infection.   SUBJECTIVE:  Maybe a little better but WOB still increased.   VITAL SIGNS: BP 108/54 mmHg  Pulse 112  Temp(Src) 97.4 F (36.3 C) (Oral)  Resp 18  Ht 5\' 4"  (1.626 m)  Wt 193 lb 12.6 oz (87.9 kg)  BMI 33.25 kg/m2  SpO2 95%  HEMODYNAMICS:    VENTILATOR SETTINGS: Vent Mode:  [-]  FiO2 (%):  [40 %] 40 %  INTAKE / OUTPUT: I/O last 3 completed shifts: In: 1930 [P.O.:1160; Other:20; IV Piggyback:750] Out: 1300 [Urine:1300]  PHYSICAL EXAMINATION: General: Elderly female, mild respiratory distress, still only able to talk in short phrases Neuro: Alert, awake, oriented. No gross focal deficits HEENT:  Moist mucous membranes, no thyromegaly, JVD, marked upper airway wheeze not improved  Cardiovascular:  Regular rate and rhythm, no MRG Lungs:  Reduced air entry, bilateral expiratory wheeze, bilateral rhonchi Abdomen: Soft, positive bowel sounds Skin:  Intact, no rash  LABS:  BMET  Recent Labs Lab 04/24/15 0348 04/25/15 0314 04/26/15 0321  NA 137 140 138  K 4.7 4.2 4.6  CL 99* 102 100*  CO2 27 28 30   BUN 19 23* 25*  CREATININE 0.82 0.81 0.87  GLUCOSE 118* 126* 110*    Electrolytes  Recent Labs Lab 04/22/15 0607  04/24/15 0348 04/25/15 0314 04/26/15 0321  CALCIUM 8.6*  < > 8.7* 8.6* 8.8*  MG 2.1  --   --  2.2 2.4  PHOS  --   --   --  3.4 3.9  < > = values in this interval not displayed.  CBC  Recent Labs Lab 04/22/15 0607 04/23/15 0537 04/26/15 0321  WBC 2.4* 4.4 5.4  HGB 10.5* 10.9* 10.4*  HCT 33.7* 35.7* 33.8*  PLT 185 214 203    Coag's No results for input(s): APTT, INR in the last 168 hours.  Sepsis Markers No results for input(s): LATICACIDVEN, PROCALCITON, O2SATVEN in the last 168  hours.  ABG  Recent Labs Lab 04/23/15 1540  PHART 7.481*  PCO2ART 38.6  PO2ART 78.1*    Liver Enzymes  Recent Labs Lab 04/21/15 1408 04/23/15 0537  AST 33 37  ALT 25 27  ALKPHOS 73 72  BILITOT 0.9 0.5  ALBUMIN 3.4* 3.4*    Cardiac Enzymes  Recent Labs Lab 04/21/15 1408  TROPONINI <0.03    Glucose No results for input(s): GLUCAP in the last 168 hours.  Imaging No results found.   STUDIES:  CTA 2/24 >> no PE, advanced emphysema, bilateral lower lobe infiltrate. Images reviewed Chest x-ray 2/26>> bilateral lower lobe infiltrates  CULTURES: RVP 2/25 Flu PCR 2/24 >> Flu B  ANTIBIOTICS: Doxy 2/26 > Tamiflu 2/25 >  SIGNIFICANT EVENTS: 2/24 Admit with AECOPD, FLuB infection  LINES/TUBES:  DISCUSSION: 75 year old with severe COPD admitted with flu B infection, acute exacerbation. She has not improved in her symptoms. Her O2 requirements have remained the same as 2/26. There appears to be a superimposed Upper airway component -->this has not resolved yet and she has sig symptom burden. She did find the morphine we added yesterday helpful and tolerated nocturnal BIPAP well. There is really nothing other to add except time at this point. Would continue current care as outlined below. She is DNR already. Would continue symptom based focus. May need to  consider palliative care involvement to a/w her symptom support.    ASSESSMENT / PLAN: COPD exacerbation. Severe COPD Flu B infection  Recs: - cont SDU level monitoring.   - added flutter  - Continue nebs (standing budesonide, duonebs), daliresp, solumedrol at 60 q12 - added nasal hygiene & humidify O2 - added prophylactic PPI - On tamiflu and doxy. - Tessalon and tussionex for cough. - Repeat CXR PRN  - added PRN SL morphine and BIPAP - consider palliative care consult  We will continue to follow.  Erick Colace ACNP-BC [place] Pager # [phone] OR # [phone] if no  answer  04/26/2015, 8:51 AM

## 2015-04-26 NOTE — Progress Notes (Signed)
PT Cancellation Note  Patient Details Name: Pamela Benton MRN: [id number] DOB: May 25, 1940   Cancelled Treatment:    Reason Eval/Treat Not Completed: Medical issues which prohibited therapy (noted very dyspneic for transfers only. Not candidate to ambulate, incr. mobility.)   [name], [name] 04/26/2015, 1:41 PM

## 2015-04-26 NOTE — Progress Notes (Signed)
PROGRESS NOTE  Pamela Benton [id number] DOB: 06-16-1940 DOA: 04/21/2015 PCP: No primary care provider on file. Brief History Pamela Benton is a 75 y.o. female with COPD on 4 L of oxygen at home, history of lung cancer status post lobectomy who presents to the hospital for cough and dyspnea at rest. The patient was treated for pneumonia about a month ago and had improved however, developed a severe cough with shortness of breath at rest and with exertion a couple of days ago. Nebulizer treatments used at home did not help. She felt tightness in her chest. No complaint of fevers. Cough is productive of clear sputum. No sore throat and runny nose or watery eyes. She is noted to be quite short of breath in the ER and has persistent cough and is therefore being admitted.  Subjective: No improvement in cough, dyspnea and dyspnea with minimal exertion. No fever, chills, nausea, vomiting, pain or diarrhea.   Assessment/Plan: Acute on chronic respiratory failure with hypoxia -secondary to influenza and COPD exacerbation -appreciate pulmonary followup -still using prn BiPAP -palliative medicine consulted -case discussed with palliative medicine--planning family meeting -prn morphine to help with distress  COPD exacerbation/ Influenza B - cont nebs, steroids, Dileresp, Tussionex and supplemental O2- using about 5-6 L O2- on 4 L at baseline - have added Tamiflu D#5, Mucinex, Pulmicort, doxycycline - still quite dyspneic - appreciate pulm consult- pulm starting CPAP and low dose Morphine  -anticipate very slow recovery with high morbidity  Hyponatremia - improved with IV normal salilne and with holding Lasix  Hypokalemia - resolved with replacement  Prolonged QT - avoid medications that will further prolong QT  Family Communication:   Pt at beside Disposition Plan:   SNF vs LTAC >3 days       Procedures/Studies: Dg Chest 1 View  04/23/2015  CLINICAL DATA:   Patient with shortness of breath. EXAM: CHEST 1 VIEW COMPARISON:  Chest radiograph 04/21/2015 FINDINGS: Stable cardiac and mediastinal contours. Interval increase in coarse interstitial opacities right-greater-than-left lung base. Persistent right perihilar opacity. Apical emphysematous change. Left chest wall surgical clips. IMPRESSION: Interval increase in basilar opacities which may represent superimposed infectious process on chronic fibrosis. Grossly unchanged right parahilar soft tissue which may represent post radiation fibrosis. Neoplasm is not excluded. Electronically Signed   By: Lovey Newcomer M.D.   On: 04/23/2015 16:28   Dg Chest 2 View  04/21/2015  CLINICAL DATA:  Shortness of breath today. History of lung cancer. Initial encounter. EXAM: CHEST  2 VIEW COMPARISON:  None. FINDINGS: Coarse bibasilar airspace opacities are identified at appears most suggestive of fibrotic change. The lungs are emphysematous. A right hilar mass lesion is seen. Surgical clips are noted left axilla. There is no pneumothorax or pleural effusion. Heart size is normal. No pneumothorax. IMPRESSION: Emphysematous lungs with coarse bibasilar airspace opacities likely due to fibrosis although pneumonia cannot be excluded. Right hilar mass. Electronically Signed   By: Inge Rise M.D.   On: 04/21/2015 13:52   Ct Angio Chest Pe W/cm &/or Wo Cm  04/21/2015  CLINICAL DATA:  Respiratory distress in a patient with lung cancer. EXAM: CT ANGIOGRAPHY CHEST WITH CONTRAST TECHNIQUE: Multidetector CT imaging of the chest was performed using the standard protocol during bolus administration of intravenous contrast. Multiplanar CT image reconstructions and MIPs were obtained to evaluate the vascular anatomy. CONTRAST:  142mL OMNIPAQUE IOHEXOL 350 MG/ML SOLN COMPARISON:  None. FINDINGS: Mediastinum / Lymph Nodes:  There is no axillary lymphadenopathy. No mediastinal lymphadenopathy. No evidence for left hilar lymphadenopathy. 8 mm short  axis lymph node is identified in the right hilum. The heart size is normal. No pericardial effusion. Coronary artery calcification is noted. No evidence for filling defect in the opacified pulmonary arteries although evaluation of the lower lobes is degraded by respiratory motion. No thoracic aortic aneurysm. No evidence for dissection flap in the thoracic aorta. Lungs / Pleura: Moderate to severe emphysema noted bilaterally. Volume loss in the right hemi thorax is associated with confluent soft tissue attenuation in the anteromedial right chest which may be related to previous radiation therapy. No evidence for pulmonary edema. No pleural effusion. Upper Abdomen: 1.6 x 2.1 cm cystic lesion identified in the left upper abdomen has been incompletely visualized, but is suspicious for a cystic lesion of the pancreatic tail. MSK / Soft Tissues: Bone windows reveal no worrisome lytic or sclerotic osseous lesions. Review of the MIP images confirms the above findings. IMPRESSION: 1. No CT evidence for acute pulmonary embolus. 2. 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 Comparison to previous imaging studies is recommended to ensure that this is stable as recurrent neoplasm could have this appearance. 3. 2.1 cm cystic lesion in the left upper abdomen has been incompletely visualized but is suspicious for a cystic process in the tail the pancreas. Pancreatic protocol CT or MR could be used to further evaluate. Electronically Signed   By: Misty Stanley M.D.   On: 04/21/2015 16:01         Subjective:  The patient remains very dyspneic with minimal exertion. Denies any fevers, chills, chest pain, short of breath, nausea, vomiting, diarrhea, abdominal pain. No dysuria or hematuria. No rashes.  Objective: Filed Vitals:   04/26/15 1000 04/26/15 1009 04/26/15 1200 04/26/15 1357  BP: 93/51 96/75 106/51   Pulse: 107 109  103 104  Temp:      TempSrc:      Resp: 16 15 23 22   Height:      Weight:      SpO2: 96% 96% 97% 94%    Intake/Output Summary (Last 24 hours) at 04/26/15 1410 Last data filed at 04/26/15 0900  Gross per 24 hour  Intake   1040 ml  Output   1000 ml  Net     40 ml   Weight change:  Exam:   General:  Pt is alert, follows commands appropriately, not in acute distress  HEENT: No icterus, No thrush, No neck mass, [place]/AT; no meningismus   Cardiovascular: RRR, S1/S2, no rubs, no gallops  Respiratory: bilateral expiratory wheeze with scattered rhonchi.  Abdomen: Soft/+BS, non tender, non distended, no guarding  Extremities: trace edema, No lymphangitis, No petechiae, No rashes, no synovitis  Data Reviewed: Basic Metabolic Panel:  Recent Labs Lab 04/22/15 0607 04/23/15 0537 04/24/15 0348 04/25/15 0314 04/26/15 0321  NA 136 137 137 140 138  K 4.0 4.5 4.7 4.2 4.6  CL 99* 98* 99* 102 100*  CO2 24 26 27 28 30   GLUCOSE 89 113* 118* 126* 110*  BUN 18 23* 19 23* 25*  CREATININE 0.86 0.93 0.82 0.81 0.87  CALCIUM 8.6* 9.0 8.7* 8.6* 8.8*  MG 2.1  --   --  2.2 2.4  PHOS  --   --   --  3.4 3.9   Liver Function Tests:  Recent Labs Lab 04/21/15 1408 04/23/15 0537  AST 33 37  ALT  25 27  ALKPHOS 73 72  BILITOT 0.9 0.5  PROT 6.4* 6.5  ALBUMIN 3.4* 3.4*   No results for input(s): LIPASE, AMYLASE in the last 168 hours. No results for input(s): AMMONIA in the last 168 hours. CBC:  Recent Labs Lab 04/21/15 1408 04/22/15 0607 04/23/15 0537 04/26/15 0321  WBC 4.2 2.4* 4.4 5.4  NEUTROABS 3.8  --   --   --   HGB 10.5* 10.5* 10.9* 10.4*  HCT 32.4* 33.7* 35.7* 33.8*  MCV 82.7 85.5 86.7 86.2  PLT 167 185 214 203   Cardiac Enzymes:  Recent Labs Lab 04/21/15 1408  TROPONINI <0.03   BNP: Invalid input(s): POCBNP CBG: No results for input(s): GLUCAP in the last 168 hours.  Recent Results (from the past 240 hour(s))  Respiratory virus panel     Status: Abnormal    Collection Time: 04/22/15  1:44 AM  Result Value Ref Range Status   Respiratory Syncytial Virus A Negative Negative Final   Respiratory Syncytial Virus B Negative Negative Final   Influenza A Negative Negative Final   Influenza B Positive (A) Negative Final   Parainfluenza 1 Negative Negative Final   Parainfluenza 2 Negative Negative Final   Parainfluenza 3 Negative Negative Final   Metapneumovirus Negative Negative Final   Rhinovirus Negative Negative Final   Adenovirus Negative Negative Final    Comment: (NOTE) Performed At: [facility] [place], [place] [id number] Lindon Romp MD [id number]   MRSA PCR Screening     Status: None   Collection Time: 04/23/15  7:41 PM  Result Value Ref Range Status   MRSA by PCR NEGATIVE NEGATIVE Final    Comment:        The GeneXpert MRSA Assay (FDA approved for NASAL specimens only), is one component of a comprehensive MRSA colonization surveillance program. It is not intended to diagnose MRSA infection nor to guide or monitor treatment for MRSA infections.      Scheduled Meds: . ALPRAZolam  0.5 mg Oral Daily  . benzonatate  200 mg Oral TID  . budesonide (PULMICORT) nebulizer solution  0.25 mg Nebulization BID  . chlorpheniramine-HYDROcodone  5 mL Oral Q12H  . citalopram  20 mg Oral Daily  . doxycycline (VIBRAMYCIN) IV  100 mg Intravenous Q12H  . enoxaparin (LOVENOX) injection  40 mg Subcutaneous Q24H  . fluticasone  2 spray Each Nare BID  . guaiFENesin  1,200 mg Oral BID  . ipratropium-albuterol  3 mL Nebulization Q4H  . memantine  28 mg Oral Daily  . methylPREDNISolone (SOLU-MEDROL) injection  60 mg Intravenous Q12H  . oseltamivir  75 mg Oral BID  . pantoprazole  40 mg Oral BID AC   Continuous Infusions:    Seraphina Mitchner, DO  [facility] Pager [phone]  If 7PM-7AM, please contact night-coverage [email or web address] Password [email or web address] 04/26/2015, 2:10 PM   LOS: 4 days

## 2015-04-27 DIAGNOSIS — Z515 Encounter for palliative care: Secondary | ICD-10-CM

## 2015-04-27 DIAGNOSIS — J9621 Acute and chronic respiratory failure with hypoxia: Secondary | ICD-10-CM

## 2015-04-27 DIAGNOSIS — Z66 Do not resuscitate: Secondary | ICD-10-CM

## 2015-04-27 LAB — BASIC METABOLIC PANEL
ANION GAP: 6 (ref 5–15)
BUN: 27 mg/dL — AB (ref 6–20)
CALCIUM: 8.5 mg/dL — AB (ref 8.9–10.3)
CO2: 31 mmol/L (ref 22–32)
Chloride: 100 mmol/L — ABNORMAL LOW (ref 101–111)
Creatinine, Ser: 0.77 mg/dL (ref 0.44–1.00)
GFR calc Af Amer: >60 mL/min (ref >60)
GLUCOSE: 114 mg/dL — AB (ref 65–99)
Potassium: 4.4 mmol/L (ref 3.5–5.1)
Sodium: 137 mmol/L (ref 135–145)

## 2015-04-27 LAB — MAGNESIUM: MAGNESIUM: 2.2 mg/dL (ref 1.7–2.4)

## 2015-04-27 LAB — PHOSPHORUS: PHOSPHORUS: 4.3 mg/dL (ref 2.5–4.6)

## 2015-04-27 MED ORDER — MORPHINE SULFATE (PF) 2 MG/ML IV SOLN: 1.0000 mg | INTRAVENOUS | Status: DC | PRN

## 2015-04-27 MED ORDER — PREDNISONE 20 MG PO TABS
40.0000 mg | ORAL_TABLET | Freq: Every day | ORAL | Status: DC
  Administered 2015-04-28 – 2015-04-29 (×2): 40 mg via ORAL
  Filled 2015-04-27 (×2): qty 2

## 2015-04-27 MED ORDER — DOXYCYCLINE HYCLATE 100 MG PO TABS
100.0000 mg | ORAL_TABLET | Freq: Two times a day (BID) | ORAL | Status: DC
  Administered 2015-04-28 – 2015-04-29 (×3): 100 mg via ORAL
  Filled 2015-04-27 (×4): qty 1

## 2015-04-27 MED ORDER — MORPHINE SULFATE 10 MG/5ML PO SOLN
4.0000 mg | Freq: Four times a day (QID) | ORAL | Status: DC
  Administered 2015-04-28: 05:00:00 via ORAL
  Filled 2015-04-27 (×2): qty 5

## 2015-04-27 MED ORDER — ALPRAZOLAM 1 MG PO TABS
1.0000 mg | ORAL_TABLET | Freq: Every day | ORAL | Status: DC
  Administered 2015-04-27 – 2015-04-29 (×3): 1 mg via ORAL
  Filled 2015-04-27 (×3): qty 1

## 2015-04-27 NOTE — Progress Notes (Signed)
OT Cancellation Note  Patient Details Name: Pamela Benton MRN: [id number] DOB: 1941/02/02   Cancelled Treatment:    Reason Eval/Treat Not Completed: Other (comment).  Spoke to [name]. Pt will be ready for OT but is asleep right now. Will check back.  Pamela Benton 04/27/2015, 12:39 PM  [name], OTR/L [phone] 04/27/2015

## 2015-04-27 NOTE — Consult Note (Signed)
Consultation Note Date: 04/27/2015   Patient Name: Pamela Benton  DOB: 09-25-1940  MRN: [id number]  Age / Sex: 75 y.o., female  PCP: No primary care provider on file. Referring Physician: Orson Eva, MD  Reason for Consultation: Establishing goals of care and Psychosocial/spiritual support  Clinical Assessment/Narrative:  75 y.o. female with COPD on 4 L of oxygen at home, history of lung cancer status post lobectomy who presents to the hospital for cough and dyspnea at rest. The patient was treated for pneumonia about a month ago and had improved however, developed a severe cough with shortness of breath at rest and with exertion a couple of days ago. Nebulizer treatments used at home did not help. Dyspneic in ER and has persistent cough and is admitted.  Per family patient has had continued physical and functional decline over the past 3 months.  Acute on Chronic respiratory failure 2/2 to influenza and COPD exacerbation.  Little improvement with current medical intervetnions  Family faced with advanced directive decisions    This NP Wadie Lessen reviewed medical records, received report from team, assessed the patient and then meet at the patient's bedside along with her son Mallie Mussel and his wife Diane  to discuss diagnosis prognosis, [place], EOL wishes disposition and options.  A  discussion was had today regarding her current medical situation and overall poor prognosis.   The difference between a aggressive medical intervention path  and a palliative comfort care path for this patient at this time was had.  Values and goals of care important to patient and family were attempted to be elicited.  Ultimate goal is comfort per son "I don't want her to suffer" .  We discussed utilization of medications for symptom management.   Natural trajectory and expectations at EOL were discussed.    Family understand the overall poor  prognosis but need to continue current medical interventions for next 12-24 hours to "see if she improves", they understand that she could decompensate at any time.  If she show no signs of improvement they will shift to a full comfort path and would be interested in a hospice facility if appropriate.   PMT team to f/u with family tomorrow --call Mallie Mussel or Bismah Weintraub at [phone] to update and clarify plan of care  Questions and concerns addressed.  Hard Choices booklet left for review. Family encouraged to call with questions or concerns.  PMT will continue to support holistically.   HCPOA: none documented but Mallie Mussel tells me he has legal paperwork and will bring in for scanning   Code Status/Advance Care Planning:  DNR      Code Status Orders        Start     Ordered   04/21/15 1654  Do not attempt resuscitation (DNR)   Continuous    Question Answer Comment  In the event of cardiac or respiratory ARREST Do not call a "code blue"   In the event of cardiac or respiratory ARREST Do not perform Intubation, CPR, defibrillation or ACLS   In the event of cardiac or respiratory ARREST Use medication by any route, position, wound care, and other measures to relive pain and suffering. May use oxygen, suction and manual treatment of airway obstruction as needed for comfort.      04/21/15 1653    Code Status History    Date Active Date Inactive Code Status Order ID Comments User Context   04/21/2015  4:48 PM 04/21/2015  4:53 PM Full Code [id number]  Debbe Odea, MD ED    Advance Directive Documentation        Most Recent Value   Type of Advance Directive  Living will, Healthcare Power of Attorney   Pre-existing out of facility DNR order (yellow form or pink MOST form)     "MOST" Form in Place?        Symptom Management:   Dyspnea: Rox[place] as written by CCM  Palliative Prophylaxis:   Aspiration, Bowel Regimen, Delirium Protocol, Frequent Pain Assessment, Oral Care and Turn  Reposition    Psycho-social/Spiritual:  Support System: Strong Desire for further Chaplaincy support:no-declined at this time Additional Recommendations: Education on Hospice  Prognosis:  poor  Discharge Planning: Pending outcomes   Chief Complaint/ Primary Diagnoses: Present on Admission:  . COPD exacerbation ([place]) . (Resolved) COPD bronchitis  I have reviewed the medical record, interviewed the patient and family, and examined the patient. The following aspects are pertinent.  Past Medical History  Diagnosis Date  . Cancer ([place])     Lung CA   Social History   Social History  . Marital Status: Single    Spouse Name: N/A  . Number of Children: N/A  . Years of Education: N/A   Social History Main Topics  . Smoking status: Former Research scientist (life sciences)  . Smokeless tobacco: None  . Alcohol Use: No  . Drug Use: No  . Sexual Activity: Not Asked   Other Topics Concern  . None   Social History Narrative  . None   No family history on file. Scheduled Meds: . ALPRAZolam  1 mg Oral Daily  . benzonatate  200 mg Oral TID  . budesonide (PULMICORT) nebulizer solution  0.25 mg Nebulization BID  . chlorpheniramine-HYDROcodone  5 mL Oral Q12H  . citalopram  20 mg Oral Daily  . doxycycline  100 mg Oral Q12H  . enoxaparin (LOVENOX) injection  40 mg Subcutaneous Q24H  . fluticasone  2 spray Each Nare BID  . guaiFENesin  1,200 mg Oral BID  . ipratropium-albuterol  3 mL Nebulization Q4H  . memantine  28 mg Oral Daily  . morphine  4 mg Oral Q6H  . pantoprazole  40 mg Oral BID AC  . [START ON 04/28/2015] predniSONE  40 mg Oral Q breakfast   Continuous Infusions:  PRN Meds:.albuterol, benzonatate, morphine CONCENTRATE, zolpidem Medications Prior to Admission:  Prior to Admission medications   Medication Sig Start Date End Date Taking? Authorizing Provider  ALPRAZolam Duanne Moron) 0.5 MG tablet Take 0.5 mg by mouth daily.   Yes Historical Provider, MD  citalopram (CELEXA) 20 MG tablet Take 20  mg by mouth daily.   Yes Historical Provider, MD  furosemide (LASIX) 20 MG tablet Take 20 mg by mouth daily.   Yes Historical Provider, MD  memantine (NAMENDA XR) 28 MG CP24 24 hr capsule Take 28 mg by mouth daily.   Yes Historical Provider, MD  roflumilast (DALIRESP) 500 MCG TABS tablet Take 500 mcg by mouth daily.   Yes Historical Provider, MD   No Known Allergies  Review of Systems  Constitutional: Positive for fatigue.  Respiratory: Positive for shortness of breath.     Physical Exam  Constitutional: She appears lethargic. She appears ill.  HENT:  Mouth/Throat: Mucous membranes are dry.  Cardiovascular: Tachycardia present.   Respiratory: Accessory muscle usage present. Tachypnea noted. She has decreased breath sounds in the right lower field and the left lower field.  Neurological: She appears lethargic.  Skin: Skin is warm and dry.  Vital Signs: BP 125/52 mmHg  Pulse 112  Temp(Src) 97.6 F (36.4 C) (Oral)  Resp 16  Ht 5\' 4"  (1.626 m)  Wt 87.9 kg (193 lb 12.6 oz)  BMI 33.25 kg/m2  SpO2 96%  SpO2: SpO2: 96 % O2 Device:SpO2: 96 % O2 Flow Rate: .O2 Flow Rate (L/min): 6 L/min  IO: Intake/output summary:  Intake/Output Summary (Last 24 hours) at 04/27/15 1309 Last data filed at 04/27/15 0845  Gross per 24 hour  Intake    800 ml  Output   1775 ml  Net   -975 ml    LBM: Last BM Date: 04/25/15 Baseline Weight: Weight: 82 kg (180 lb 12.4 oz) Most recent weight: Weight: 87.9 kg (193 lb 12.6 oz)      Palliative Assessment/Data:  Flowsheet Rows        Most Recent Value   Intake Tab    Referral Department  Critical care   Unit at Time of Referral  ICU   Palliative Care Primary Diagnosis  Pulmonary   Date Notified  04/26/15   Palliative Care Type  New Palliative care   Reason for referral  Clarify Goals of Care   Date of Admission  04/21/15   # of days IP prior to Palliative referral  5   Clinical Assessment    Psychosocial & Spiritual Assessment    Palliative  Care Outcomes       Additional Data Reviewed:  CBC:    Component Value Date/Time   WBC 5.4 04/26/2015 0321   HGB 10.4* 04/26/2015 0321   HCT 33.8* 04/26/2015 0321   PLT 203 04/26/2015 0321   MCV 86.2 04/26/2015 0321   NEUTROABS 3.8 04/21/2015 1408   LYMPHSABS 0.2* 04/21/2015 1408   MONOABS 0.3 04/21/2015 1408   EOSABS 0.0 04/21/2015 1408   BASOSABS 0.0 04/21/2015 1408   Comprehensive Metabolic Panel:    Component Value Date/Time   NA 137 04/27/2015 0328   K 4.4 04/27/2015 0328   CL 100* 04/27/2015 0328   CO2 31 04/27/2015 0328   BUN 27* 04/27/2015 0328   CREATININE 0.77 04/27/2015 0328   GLUCOSE 114* 04/27/2015 0328   CALCIUM 8.5* 04/27/2015 0328   AST 37 04/23/2015 0537   ALT 27 04/23/2015 0537   ALKPHOS 72 04/23/2015 0537   BILITOT 0.5 04/23/2015 0537   PROT 6.5 04/23/2015 0537   ALBUMIN 3.4* 04/23/2015 0537   Discussed with Dr Tat   Time In: 1515 Time Out: 1630 Time Total: 75 min Greater than 50%  of this time was spent counseling and coordinating care related to the above assessment and plan.  Signed by: Wadie Lessen, NP  Knox Royalty, NP  04/27/2015, 1:09 PM  Please contact Palliative Medicine Team phone at [phone] for questions and concerns.

## 2015-04-27 NOTE — Progress Notes (Signed)
PULMONARY / CRITICAL CARE MEDICINE   Name: Pamela Benton MRN: [id number] DOB: [birth year]    ADMISSION DATE:  04/21/2015 CONSULTATION DATE:  04/23/15  REFERRING MD:  Reggy Eye  CHIEF COMPLAINT:  COPD exacerbation, Influenza infection.   SUBJECTIVE:  Maybe a little better needs increased medications for breathing anxiety  VITAL SIGNS: BP 95/59 mmHg  Pulse 106  Temp(Src) 96.8 F (36 C) (Axillary)  Resp 16  Ht 5\' 4"  (1.626 m)  Wt 193 lb 12.6 oz (87.9 kg)  BMI 33.25 kg/m2  SpO2 97%  HEMODYNAMICS:    VENTILATOR SETTINGS: Vent Mode:  [-]  FiO2 (%):  [40 %] 40 %  INTAKE / OUTPUT: I/O last 3 completed shifts: In: 1590 [P.O.:840; IV Piggyback:750] Out: 2600 [Urine:2600]  PHYSICAL EXAMINATION: General: Elderly female,  respiratory distress, still only able to talk in short phrases Neuro: Alert, awake, oriented. No gross focal deficits, HOH HEENT:  Moist mucous membranes, no thyromegaly, JVD, marked upper airway wheeze not improved  Cardiovascular:  Regular rate and rhythm, no MRG Lungs:  Reduced air entry, bilateral expiratory wheeze, bilateral rhonchi Abdomen: Soft, positive bowel sounds Skin:  Intact, no rash  LABS:  BMET  Recent Labs Lab 04/25/15 0314 04/26/15 0321 04/27/15 0328  NA 140 138 137  K 4.2 4.6 4.4  CL 102 100* 100*  CO2 28 30 31   BUN 23* 25* 27*  CREATININE 0.81 0.87 0.77  GLUCOSE 126* 110* 114*    Electrolytes  Recent Labs Lab 04/25/15 0314 04/26/15 0321 04/27/15 0328  CALCIUM 8.6* 8.8* 8.5*  MG 2.2 2.4 2.2  PHOS 3.4 3.9 4.3    CBC  Recent Labs Lab 04/22/15 0607 04/23/15 0537 04/26/15 0321  WBC 2.4* 4.4 5.4  HGB 10.5* 10.9* 10.4*  HCT 33.7* 35.7* 33.8*  PLT 185 214 203    Coag's No results for input(s): APTT, INR in the last 168 hours.  Sepsis Markers No results for input(s): LATICACIDVEN, PROCALCITON, O2SATVEN in the last 168 hours.  ABG  Recent Labs Lab 04/23/15 1540  PHART 7.481*  PCO2ART 38.6  PO2ART  78.1*    Liver Enzymes  Recent Labs Lab 04/21/15 1408 04/23/15 0537  AST 33 37  ALT 25 27  ALKPHOS 73 72  BILITOT 0.9 0.5  ALBUMIN 3.4* 3.4*    Cardiac Enzymes  Recent Labs Lab 04/21/15 1408  TROPONINI <0.03    Glucose No results for input(s): GLUCAP in the last 168 hours.  Imaging No results found.   STUDIES:  CTA 2/24 >> no PE, advanced emphysema, bilateral lower lobe infiltrate. Images reviewed Chest x-ray 2/26>> bilateral lower lobe infiltrates  CULTURES: RVP 2/25 Flu PCR 2/24 >> Flu B  ANTIBIOTICS: Doxy 2/26 > Tamiflu 2/25 >  SIGNIFICANT EVENTS: 2/24 Admit with AECOPD, FLuB infection  LINES/TUBES:  DISCUSSION: 75 year old with severe COPD admitted with flu B infection, acute exacerbation. She has not improved in her symptoms. Her O2 requirements have remained the same as 2/26. There appears to be a superimposed Upper airway component -->this has not resolved yet and she has sig symptom burden. She did find the morphine we added yesterday helpful and tolerated nocturnal BIPAP well. There is really nothing other to add except time at this point. Would continue current care as outlined below. She is DNR already. Would continue symptom based focus. May need to consider palliative care involvement to a/w her symptom support.    ASSESSMENT / PLAN: COPD exacerbation. Severe COPD Flu B infection  Recs: - cont SDU level  monitoring.   - added flutter  - Continue nebs (standing budesonide, duonebs), daliresp, solumedrol at 60 q12 - added nasal hygiene & humidify O2 - added prophylactic PPI - On tamiflu and doxy. - Tessalon and tussionex for cough. - added PRN SL morphine and IV MSO4, can't tolerate bipap - Increased Xanax 3/2 -  palliative care consult  Richardson Landry Leita Lindbloom ACNP Maryanna Shape PCCM Pager [phone] till 3 pm If no answer page [phone] 04/27/2015, 8:42 AM

## 2015-04-27 NOTE — Progress Notes (Signed)
PROGRESS NOTE  Pamela Benton [id number] DOB: 01-08-1941 DOA: 04/21/2015 PCP: No primary care provider on file.   Brief History Pamela Benton is a 75 y.o. female with COPD on 4 L of oxygen at home, history of lung cancer status post lobectomy who presents to the hospital for cough and dyspnea at rest. The patient was treated for pneumonia about a month ago and had improved however, developed a severe cough with shortness of breath at rest and with exertion a couple of days ago. Nebulizer treatments used at home did not help. She felt tightness in her chest. No complaint of fevers. Cough is productive of clear sputum. No sore throat and runny nose or watery eyes. She is noted to be quite short of breath in the ER and has persistent cough and is therefore being admitted.  Subjective: No improvement in cough, dyspnea and dyspnea with minimal exertion. No fever, chills, nausea, vomiting, pain or diarrhea.   Assessment/Plan: Acute on chronic respiratory failure with hypoxia -secondary to influenza and COPD exacerbation -appreciate pulmonary followup -04/27/15--case discussed with pulmonary--Dr. Ramaswamy-->overall poor prognosis with unpredictable prolonged recovery -pt unable to tolerate BiPAP -palliative medicine consulted -case discussed with palliative medicine--planning family meeting -scheduled morphine to help with distress/dyspnea -pt with no pulmonary reserve/very deconditioned--essentially bed bound  COPD exacerbation/ Influenza B - cont nebs, steroids, Dileresp, Tussionex and supplemental O2- using about 5-6 L O2- on 4 L at baseline - Finished Tamiflu D#5 -Mucinex, Pulmicort -continue doxycycline D#5 --pt with no pulmonary reserve/very deconditioned--essentially bed bound -anticipate very slow recovery with high morbidity -04/27/15--pulmonary signed off -04/27/15--switched to po prednisone  Hyponatremia - improved with IV normal salilne and with holding  Lasix  Hypokalemia - resolved with replacement  Prolonged QT - avoid medications that will further prolong QT  Hx of lung cancer -s/p lobectomy  Family Communication: Pt at beside Disposition Plan: SNF vs LTAC vs home hospice;  Transfer to floor today      Procedures/Studies: Dg Chest 1 View  04/23/2015  CLINICAL DATA:  Patient with shortness of breath. EXAM: CHEST 1 VIEW COMPARISON:  Chest radiograph 04/21/2015 FINDINGS: Stable cardiac and mediastinal contours. Interval increase in coarse interstitial opacities right-greater-than-left lung base. Persistent right perihilar opacity. Apical emphysematous change. Left chest wall surgical clips. IMPRESSION: Interval increase in basilar opacities which may represent superimposed infectious process on chronic fibrosis. Grossly unchanged right parahilar soft tissue which may represent post radiation fibrosis. Neoplasm is not excluded. Electronically Signed   By: Lovey Newcomer M.D.   On: 04/23/2015 16:28   Dg Chest 2 View  04/21/2015  CLINICAL DATA:  Shortness of breath today. History of lung cancer. Initial encounter. EXAM: CHEST  2 VIEW COMPARISON:  None. FINDINGS: Coarse bibasilar airspace opacities are identified at appears most suggestive of fibrotic change. The lungs are emphysematous. A right hilar mass lesion is seen. Surgical clips are noted left axilla. There is no pneumothorax or pleural effusion. Heart size is normal. No pneumothorax. IMPRESSION: Emphysematous lungs with coarse bibasilar airspace opacities likely due to fibrosis although pneumonia cannot be excluded. Right hilar mass. Electronically Signed   By: Inge Rise M.D.   On: 04/21/2015 13:52   Ct Angio Chest Pe W/cm &/or Wo Cm  04/21/2015  CLINICAL DATA:  Respiratory distress in a patient with lung cancer. EXAM: CT ANGIOGRAPHY CHEST WITH CONTRAST TECHNIQUE: Multidetector CT imaging of the chest was performed using the standard protocol during bolus administration of  intravenous contrast. Multiplanar CT image reconstructions and MIPs were obtained to evaluate the vascular anatomy. CONTRAST:  184mL OMNIPAQUE IOHEXOL 350 MG/ML SOLN COMPARISON:  None. FINDINGS: Mediastinum / Lymph Nodes: There is no axillary lymphadenopathy. No mediastinal lymphadenopathy. No evidence for left hilar lymphadenopathy. 8 mm short axis lymph node is identified in the right hilum. The heart size is normal. No pericardial effusion. Coronary artery calcification is noted. No evidence for filling defect in the opacified pulmonary arteries although evaluation of the lower lobes is degraded by respiratory motion. No thoracic aortic aneurysm. No evidence for dissection flap in the thoracic aorta. Lungs / Pleura: Moderate to severe emphysema noted bilaterally. Volume loss in the right hemi thorax is associated with confluent soft tissue attenuation in the anteromedial right chest which may be related to previous radiation therapy. No evidence for pulmonary edema. No pleural effusion. Upper Abdomen: 1.6 x 2.1 cm cystic lesion identified in the left upper abdomen has been incompletely visualized, but is suspicious for a cystic lesion of the pancreatic tail. MSK / Soft Tissues: Bone windows reveal no worrisome lytic or sclerotic osseous lesions. Review of the MIP images confirms the above findings. IMPRESSION: 1. No CT evidence for acute pulmonary embolus. 2. 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 Comparison to previous imaging studies is recommended to ensure that this is stable as recurrent neoplasm could have this appearance. 3. 2.1 cm cystic lesion in the left upper abdomen has been incompletely visualized but is suspicious for a cystic process in the tail the pancreas. Pancreatic protocol CT or MR could be used to further evaluate. Electronically Signed   By: Misty Stanley M.D.   On: 04/21/2015 16:01          Subjective: Patient unable to tolerate BiPAP. Had significant dyspnea with minimal movement. Denies any headache, chest pain, vomiting, diarrhea, abdominal pain, dysuria, hematuria.  Objective: Filed Vitals:   04/27/15 0858 04/27/15 0900 04/27/15 0921 04/27/15 1200  BP:   125/52   Pulse:  113 112   Temp:    97.6 F (36.4 C)  TempSrc:      Resp:  15 16   Height:      Weight:      SpO2: 99% 100% 96%     Intake/Output Summary (Last 24 hours) at 04/27/15 1403 Last data filed at 04/27/15 1314  Gross per 24 hour  Intake    800 ml  Output   2525 ml  Net  -1725 ml   Weight change:  Exam:   General:  Pt is alert, follows commands appropriately, not in acute distress  HEENT: No icterus, No thrush, No neck mass, [place]/AT  Cardiovascular: RRR, S1/S2, no rubs, no gallops  Respiratory: Bibasilar crackles with bilateral expiratory wheezes.  Abdomen: Soft/+BS, non tender, non distended, no guarding; no hepatosplenomegaly  Extremities: trace LE edema, No lymphangitis, No petechiae, No rashes, no synovitis; no cyanosis  Data Reviewed: Basic Metabolic Panel:  Recent Labs Lab 04/22/15 0607 04/23/15 0537 04/24/15 0348 04/25/15 0314 04/26/15 0321 04/27/15 0328  NA 136 137 137 140 138 137  K 4.0 4.5 4.7 4.2 4.6 4.4  CL 99* 98* 99* 102 100* 100*  CO2 24 26 27 28 30 31   GLUCOSE 89 113* 118* 126* 110* 114*  BUN 18 23* 19 23* 25* 27*  CREATININE 0.86 0.93 0.82 0.81 0.87 0.77  CALCIUM 8.6* 9.0 8.7* 8.6* 8.8* 8.5*  MG 2.1  --   --  2.2 2.4 2.2  PHOS  --   --   --  3.4 3.9 4.3   Liver Function Tests:  Recent Labs Lab 04/21/15 1408 04/23/15 0537  AST 33 37  ALT 25 27  ALKPHOS 73 72  BILITOT 0.9 0.5  PROT 6.4* 6.5  ALBUMIN 3.4* 3.4*   No results for input(s): LIPASE, AMYLASE in the last 168 hours. No results for input(s): AMMONIA in the last 168 hours. CBC:  Recent Labs Lab 04/21/15 1408 04/22/15 0607 04/23/15 0537 04/26/15 0321  WBC 4.2 2.4* 4.4 5.4   NEUTROABS 3.8  --   --   --   HGB 10.5* 10.5* 10.9* 10.4*  HCT 32.4* 33.7* 35.7* 33.8*  MCV 82.7 85.5 86.7 86.2  PLT 167 185 214 203   Cardiac Enzymes:  Recent Labs Lab 04/21/15 1408  TROPONINI <0.03   BNP: Invalid input(s): POCBNP CBG: No results for input(s): GLUCAP in the last 168 hours.  Recent Results (from the past 240 hour(s))  Respiratory virus panel     Status: Abnormal   Collection Time: 04/22/15  1:44 AM  Result Value Ref Range Status   Respiratory Syncytial Virus A Negative Negative Final   Respiratory Syncytial Virus B Negative Negative Final   Influenza A Negative Negative Final   Influenza B Positive (A) Negative Final   Parainfluenza 1 Negative Negative Final   Parainfluenza 2 Negative Negative Final   Parainfluenza 3 Negative Negative Final   Metapneumovirus Negative Negative Final   Rhinovirus Negative Negative Final   Adenovirus Negative Negative Final    Comment: (NOTE) Performed At: [facility] [place], [place] [id number] Lindon Romp MD [id number]   MRSA PCR Screening     Status: None   Collection Time: 04/23/15  7:41 PM  Result Value Ref Range Status   MRSA by PCR NEGATIVE NEGATIVE Final    Comment:        The GeneXpert MRSA Assay (FDA approved for NASAL specimens only), is one component of a comprehensive MRSA colonization surveillance program. It is not intended to diagnose MRSA infection nor to guide or monitor treatment for MRSA infections.      Scheduled Meds: . ALPRAZolam  1 mg Oral Daily  . benzonatate  200 mg Oral TID  . budesonide (PULMICORT) nebulizer solution  0.25 mg Nebulization BID  . chlorpheniramine-HYDROcodone  5 mL Oral Q12H  . citalopram  20 mg Oral Daily  . doxycycline  100 mg Oral Q12H  . enoxaparin (LOVENOX) injection  40 mg Subcutaneous Q24H  . fluticasone  2 spray Each Nare BID  . guaiFENesin  1,200 mg Oral BID  . ipratropium-albuterol  3 mL Nebulization Q4H  . memantine   28 mg Oral Daily  . morphine  4 mg Oral Q6H  . pantoprazole  40 mg Oral BID AC  . [START ON 04/28/2015] predniSONE  40 mg Oral Q breakfast   Continuous Infusions:    Pamela Niznik, DO  [facility] Pager [phone]  If 7PM-7AM, please contact night-coverage [email or web address] Password [email or web address] 04/27/2015, 2:03 PM   LOS: 5 days

## 2015-04-27 NOTE — Progress Notes (Signed)
Bipap taken off and 5L of O2 placed via N/C. O2 sats-96%

## 2015-04-28 DIAGNOSIS — Z515 Encounter for palliative care: Secondary | ICD-10-CM

## 2015-04-28 DIAGNOSIS — Z7189 Other specified counseling: Secondary | ICD-10-CM

## 2015-04-28 LAB — BASIC METABOLIC PANEL
Anion gap: 8 (ref 5–15)
BUN: 42 mg/dL — AB (ref 6–20)
CHLORIDE: 101 mmol/L (ref 101–111)
CO2: 31 mmol/L (ref 22–32)
CREATININE: 0.88 mg/dL (ref 0.44–1.00)
Calcium: 8.6 mg/dL — ABNORMAL LOW (ref 8.9–10.3)
GFR calc Af Amer: >60 mL/min (ref >60)
GFR calc non Af Amer: >60 mL/min (ref >60)
Glucose, Bld: 86 mg/dL (ref 65–99)
Potassium: 3.5 mmol/L (ref 3.5–5.1)
SODIUM: 140 mmol/L (ref 135–145)

## 2015-04-28 LAB — MAGNESIUM: MAGNESIUM: 2.4 mg/dL (ref 1.7–2.4)

## 2015-04-28 LAB — PHOSPHORUS: Phosphorus: 3.1 mg/dL (ref 2.5–4.6)

## 2015-04-28 MED ORDER — MORPHINE SULFATE 10 MG/5ML PO SOLN
4.0000 mg | Freq: Four times a day (QID) | ORAL | Status: DC
  Administered 2015-04-28 – 2015-04-29 (×4): 4 mg via ORAL
  Filled 2015-04-28 (×4): qty 5

## 2015-04-28 MED ORDER — IPRATROPIUM-ALBUTEROL 0.5-2.5 (3) MG/3ML IN SOLN
3.0000 mL | Freq: Four times a day (QID) | RESPIRATORY_TRACT | Status: DC
  Administered 2015-04-28 – 2015-04-29 (×6): 3 mL via RESPIRATORY_TRACT
  Filled 2015-04-28 (×6): qty 3

## 2015-04-28 NOTE — Progress Notes (Signed)
Physical Therapy Treatment Patient Details Name: Pamela Benton MRN: [id number] DOB: 12-24-1940 Today's Date: 04/28/2015    History of Present Illness Pamela Benton is a 75 y.o. female with COPD on 4 L of oxygen at home, history of lung cancer status post lobectomy who presents to the hospital 04/21/15 for cough and dyspnea at rest. The patient was treated for pneumonia about a month ago. R/O flu,    PT Comments    Pt OOB in recliner on 5 lts nasal 96% and HR 109.  2 sisters in room.  Assisted pt to [facility] to void required increased time and much effort.  Very weak.  Feels "really bad".  Required extended rest break before assisting her off BSC.  Barley able to stand long enough to assist pt with hygiene after voiding.  Allowed another extended rest break and instructed on purse lip breathing.  Definite WOB.  Unable to attempt amb at this time due to very limited activity tolerance and overall weakness/fatigue.  Pt did wish to stay in recliner which it was good to see her up. Sats remained above 90% on 5 lts with activity and HR ranged from 109 - 122.    Follow Up Recommendations  No PT follow up     Equipment Recommendations  None recommended by PT    Recommendations for Other Services       Precautions / Restrictions Precautions Precautions: Fall Precaution Comments: droplet Restrictions Weight Bearing Restrictions: No    Mobility  Bed Mobility               General bed mobility comments: pt OOB in recliner  Transfers Overall transfer level: Needs assistance Equipment used: None Transfers: Sit to/from [employer] Transfers Sit to Stand: Mod assist Stand pivot transfers: Mod assist       General transfer comment: very weak.  Feels bad.  Assisted from recliner to [facility] partial pivot completed by therapist assist.  this activity completely exhausted pt.  Required increased, increased rest period to revover after ech activity.  Little relief even with purse lip  breathing.  Def WOB.   Ambulation/Gait             General Gait Details: unable to tolerate after using BSC.  Dyspnea 4/4.     Stairs            Wheelchair Mobility    Modified Rankin (Stroke Patients Only)       Balance                                    Cognition Arousal/Alertness: Awake/alert Behavior During Therapy: WFL for tasks assessed/performed Overall Cognitive Status: Within Functional Limits for tasks assessed                      Exercises      General Comments        Pertinent Vitals/Pain Pain Assessment: No/denies pain    Home Living                      Prior Function            PT Goals (current goals can now be found in the care plan section) Progress towards PT goals: Progressing toward goals    Frequency  Min 3X/week    PT Plan Current plan remains appropriate    Co-evaluation  End of Session Equipment Utilized During Treatment: Gait belt Activity Tolerance: Treatment limited secondary to medical complications (Comment) Patient left: in chair;with call bell/phone within reach;with chair alarm set;with family/visitor present (2 sisters in room)     Time: 1350-1415 PT Time Calculation (min) (ACUTE ONLY): 25 min  Charges:  $Therapeutic Activity: 23-37 mins                    G Codes:      Rica Koyanagi  PTA [facility]  Acute  Rehab Pager      [phone]

## 2015-04-28 NOTE — Progress Notes (Signed)
Spoke with son Wenda Overland concerning discharge plan. Son would like [employer]. CSW will follow.

## 2015-04-28 NOTE — Progress Notes (Signed)
PROGRESS NOTE  Pamela Benton [id number] DOB: 11-11-1940 DOA: 04/21/2015 PCP: No primary care provider on file.  Brief History Pamela Benton is a 75 y.o. female with COPD on 4 L of oxygen at home, history of lung cancer status post lobectomy who presents to the hospital for cough and dyspnea at rest. The patient was treated for pneumonia about a month ago and had improved however, developed a severe cough with shortness of breath at rest and with exertion a couple of days ago. Nebulizer treatments used at home did not help. She felt tightness in her chest. No complaint of fevers. Cough is productive of clear sputum. No sore throat and runny nose or watery eyes. She is noted to be quite short of breath in the ER and has persistent cough and is therefore being admitted.  Subjective: No improvement in cough, continued dyspnea with minimal exertion. No fever, chills, nausea, vomiting, abd pain or diarrhea.   Assessment/Plan: Acute on chronic respiratory failure with hypoxia -secondary to influenza and COPD exacerbation -appreciate pulmonary followup -04/27/15--case discussed with pulmonary--Dr. Ramaswamy-->overall poor prognosis with unpredictable prolonged recovery -pt unable to tolerate BiPAP -palliative medicine consult appreciated -04/27/15- case discussed with palliative medicine -scheduled morphine to help with distress/dyspnea -pt with no pulmonary reserve/very deconditioned--essentially bed bound -without scheduled morphine/alprazolam, pt had significant distress with minimal exertion -clinically little change over past few days  COPD exacerbation/ Influenza B - cont nebs, steroids, Dileresp, Tussionex and supplemental O2- using about 5-6 L O2- on 4 L at baseline - Finished Tamiflu D#5 -Mucinex, Pulmicort -continue doxycycline D#6/7 --pt with no pulmonary reserve/very deconditioned--essentially bed bound -anticipate very slow recovery with high  morbidity -04/27/15--pulmonary signed off -04/27/15--switched to po prednisone  Hyponatremia - improved with IV normal salilne and with holding Lasix  Hypokalemia - resolved with replacement  Prolonged QT - avoid medications that will further prolong QT  Hx of lung cancer -s/p lobectomy  Family Communication: family updated at bedside, but Henry/Diane not present Disposition Plan: SNF vs LTAC vs home hospice    Procedures/Studies: Dg Chest 1 View  04/23/2015  CLINICAL DATA:  Patient with shortness of breath. EXAM: CHEST 1 VIEW COMPARISON:  Chest radiograph 04/21/2015 FINDINGS: Stable cardiac and mediastinal contours. Interval increase in coarse interstitial opacities right-greater-than-left lung base. Persistent right perihilar opacity. Apical emphysematous change. Left chest wall surgical clips. IMPRESSION: Interval increase in basilar opacities which may represent superimposed infectious process on chronic fibrosis. Grossly unchanged right parahilar soft tissue which may represent post radiation fibrosis. Neoplasm is not excluded. Electronically Signed   By: Lovey Newcomer M.D.   On: 04/23/2015 16:28   Dg Chest 2 View  04/21/2015  CLINICAL DATA:  Shortness of breath today. History of lung cancer. Initial encounter. EXAM: CHEST  2 VIEW COMPARISON:  None. FINDINGS: Coarse bibasilar airspace opacities are identified at appears most suggestive of fibrotic change. The lungs are emphysematous. A right hilar mass lesion is seen. Surgical clips are noted left axilla. There is no pneumothorax or pleural effusion. Heart size is normal. No pneumothorax. IMPRESSION: Emphysematous lungs with coarse bibasilar airspace opacities likely due to fibrosis although pneumonia cannot be excluded. Right hilar mass. Electronically Signed   By: Inge Rise M.D.   On: 04/21/2015 13:52   Ct Angio Chest Pe W/cm &/or Wo Cm  04/21/2015  CLINICAL DATA:  Respiratory distress in a patient with lung cancer. EXAM: CT  ANGIOGRAPHY CHEST WITH CONTRAST TECHNIQUE: Multidetector CT  imaging of the chest was performed using the standard protocol during bolus administration of intravenous contrast. Multiplanar CT image reconstructions and MIPs were obtained to evaluate the vascular anatomy. CONTRAST:  128mL OMNIPAQUE IOHEXOL 350 MG/ML SOLN COMPARISON:  None. FINDINGS: Mediastinum / Lymph Nodes: There is no axillary lymphadenopathy. No mediastinal lymphadenopathy. No evidence for left hilar lymphadenopathy. 8 mm short axis lymph node is identified in the right hilum. The heart size is normal. No pericardial effusion. Coronary artery calcification is noted. No evidence for filling defect in the opacified pulmonary arteries although evaluation of the lower lobes is degraded by respiratory motion. No thoracic aortic aneurysm. No evidence for dissection flap in the thoracic aorta. Lungs / Pleura: Moderate to severe emphysema noted bilaterally. Volume loss in the right hemi thorax is associated with confluent soft tissue attenuation in the anteromedial right chest which may be related to previous radiation therapy. No evidence for pulmonary edema. No pleural effusion. Upper Abdomen: 1.6 x 2.1 cm cystic lesion identified in the left upper abdomen has been incompletely visualized, but is suspicious for a cystic lesion of the pancreatic tail. MSK / Soft Tissues: Bone windows reveal no worrisome lytic or sclerotic osseous lesions. Review of the MIP images confirms the above findings. IMPRESSION: 1. No CT evidence for acute pulmonary embolus. 2. 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 Comparison to previous imaging studies is recommended to ensure that this is stable as recurrent neoplasm could have this appearance. 3. 2.1 cm cystic lesion in the left upper abdomen has been incompletely visualized but is suspicious for a cystic process in the tail  the pancreas. Pancreatic protocol CT or MR could be used to further evaluate. Electronically Signed   By: Misty Stanley M.D.   On: 04/21/2015 16:01            Objective: Filed Vitals:   04/28/15 0800 04/28/15 0811 04/28/15 1056 04/28/15 1436  BP: 98/56  111/63 111/60  Pulse: 106  116 107  Temp:  97.8 F (36.6 C) 98.2 F (36.8 C) 97.6 F (36.4 C)  TempSrc:  Oral Oral Oral  Resp: 41  22 20  Height:      Weight:      SpO2: 90% 92% 91% 93%    Intake/Output Summary (Last 24 hours) at 04/28/15 1526 Last data filed at 04/28/15 0900  Gross per 24 hour  Intake    240 ml  Output    350 ml  Net   -110 ml   Weight change:  Exam:   General:  Pt is alert, follows commands appropriately, not in acute distress  HEENT: No icterus, No thrush, No neck mass, [place]/AT  Cardiovascular: RRR, S1/S2, no rubs, no gallops  Respiratory: Bilateral scattered rales. Bibasilar wheezes. Good air movement.  Abdomen: Soft/+BS, non tender, non distended, no guarding  Extremities: trace LE edema, No lymphangitis, No petechiae, No rashes, no synovitis  Data Reviewed: Basic Metabolic Panel:  Recent Labs Lab 04/22/15 0607  04/24/15 0348 04/25/15 0314 04/26/15 0321 04/27/15 0328 04/28/15 0440  NA 136  < > 137 140 138 137 140  K 4.0  < > 4.7 4.2 4.6 4.4 3.5  CL 99*  < > 99* 102 100* 100* 101  CO2 24  < > 27 28 30 31 31   GLUCOSE 89  < > 118* 126* 110* 114* 86  BUN 18  < > 19 23* 25* 27* 42*  CREATININE 0.86  < >  0.82 0.81 0.87 0.77 0.88  CALCIUM 8.6*  < > 8.7* 8.6* 8.8* 8.5* 8.6*  MG 2.1  --   --  2.2 2.4 2.2 2.4  PHOS  --   --   --  3.4 3.9 4.3 3.1  < > = values in this interval not displayed. Liver Function Tests:  Recent Labs Lab 04/23/15 0537  AST 37  ALT 27  ALKPHOS 72  BILITOT 0.5  PROT 6.5  ALBUMIN 3.4*   No results for input(s): LIPASE, AMYLASE in the last 168 hours. No results for input(s): AMMONIA in the last 168 hours. CBC:  Recent Labs Lab 04/22/15 0607  04/23/15 0537 04/26/15 0321  WBC 2.4* 4.4 5.4  HGB 10.5* 10.9* 10.4*  HCT 33.7* 35.7* 33.8*  MCV 85.5 86.7 86.2  PLT 185 214 203   Cardiac Enzymes: No results for input(s): CKTOTAL, CKMB, CKMBINDEX, TROPONINI in the last 168 hours. BNP: Invalid input(s): POCBNP CBG: No results for input(s): GLUCAP in the last 168 hours.  Recent Results (from the past 240 hour(s))  Respiratory virus panel     Status: Abnormal   Collection Time: 04/22/15  1:44 AM  Result Value Ref Range Status   Respiratory Syncytial Virus A Negative Negative Final   Respiratory Syncytial Virus B Negative Negative Final   Influenza A Negative Negative Final   Influenza B Positive (A) Negative Final   Parainfluenza 1 Negative Negative Final   Parainfluenza 2 Negative Negative Final   Parainfluenza 3 Negative Negative Final   Metapneumovirus Negative Negative Final   Rhinovirus Negative Negative Final   Adenovirus Negative Negative Final    Comment: (NOTE) Performed At: [facility] [place], [place] [id number] Lindon Romp MD [id number]   MRSA PCR Screening     Status: None   Collection Time: 04/23/15  7:41 PM  Result Value Ref Range Status   MRSA by PCR NEGATIVE NEGATIVE Final    Comment:        The GeneXpert MRSA Assay (FDA approved for NASAL specimens only), is one component of a comprehensive MRSA colonization surveillance program. It is not intended to diagnose MRSA infection nor to guide or monitor treatment for MRSA infections.      Scheduled Meds: . ALPRAZolam  1 mg Oral Daily  . benzonatate  200 mg Oral TID  . budesonide (PULMICORT) nebulizer solution  0.25 mg Nebulization BID  . chlorpheniramine-HYDROcodone  5 mL Oral Q12H  . citalopram  20 mg Oral Daily  . doxycycline  100 mg Oral Q12H  . enoxaparin (LOVENOX) injection  40 mg Subcutaneous Q24H  . fluticasone  2 spray Each Nare BID  . guaiFENesin  1,200 mg Oral BID  . ipratropium-albuterol  3 mL  Nebulization QID  . memantine  28 mg Oral Daily  . morphine  4 mg Oral Q6H  . pantoprazole  40 mg Oral BID AC  . predniSONE  40 mg Oral Q breakfast   Continuous Infusions:    Kelon Easom, DO  [facility] Pager [phone]  If 7PM-7AM, please contact night-coverage [email or web address] Password [email or web address] 04/28/2015, 3:26 PM   LOS: 6 days

## 2015-04-28 NOTE — Progress Notes (Signed)
Daily Progress Note   Patient Name: Pamela Benton       Date: 04/28/2015 DOB: 11-23-1940  Age: 75 y.o. MRN#: [id number] Attending Physician: Orson Eva, MD Primary Care Physician: No primary care provider on file. Admit Date: 04/21/2015  Reason for Consultation/Follow-up: Establishing goals of care  Subjective: I met with patient, her son [facility]), her sister, her daughter in law, and several other family members.  They report understanding that [name] is very sick and coming to the end of her life.  Family reports the most important things are her comfort and finding a place that can care for her and her needs as she approaches the end of life.    The patient remains pleasantly confused and is able to converse but has no insight into her medical condition.  Family had talked with about her condition and she became very upset but does not remember these conversations long term.  Family has been giving her limited information and asked this of her care team as she does not remember and it upsets her when it is discussed.  We talked about her clinical course to this point in time, her lack of improvement, and her tenuous respiratory status and likelihood that she will decompensate and die in the near future, especially as she is no longer using Bipap.  Length of Stay: 6 days  Current Medications: Scheduled Meds:  . ALPRAZolam  1 mg Oral Daily  . benzonatate  200 mg Oral TID  . budesonide (PULMICORT) nebulizer solution  0.25 mg Nebulization BID  . chlorpheniramine-HYDROcodone  5 mL Oral Q12H  . citalopram  20 mg Oral Daily  . doxycycline  100 mg Oral Q12H  . enoxaparin (LOVENOX) injection  40 mg Subcutaneous Q24H  . fluticasone  2 spray Each Nare BID  . guaiFENesin  1,200 mg Oral  BID  . ipratropium-albuterol  3 mL Nebulization QID  . memantine  28 mg Oral Daily  . morphine  4 mg Oral Q6H  . pantoprazole  40 mg Oral BID AC  . predniSONE  40 mg Oral Q breakfast    Continuous Infusions:    PRN Meds: albuterol, benzonatate, morphine CONCENTRATE, zolpidem  Physical Exam: Physical Exam      General: Pt is alert but sits during encouter with eyes closed  in mild-moderate respiratory distress.  HEENT: No icterus, No thrush, No neck mass, [place]/AT  Cardiovascular: RRR, S1/S2, no rubs, no gallops  Respiratory: Bilateral scattered rales. Bibasilar wheezes. Fair-poor air movement.  Abdomen: Soft/+BS, non tender, non distended, no guarding Extremities: trace LE edema, No lymphangitis, No petechiae, No rashes, no synovitis          Vital Signs: BP 112/74 mmHg  Pulse 106  Temp(Src) 97.7 F (36.5 C) (Oral)  Resp 24  Ht 5' 4"  (1.626 m)  Wt 87.9 kg (193 lb 12.6 oz)  BMI 33.25 kg/m2  SpO2 93% SpO2: SpO2: 93 % O2 Device: O2 Device: Nasal Cannula O2 Flow Rate: O2 Flow Rate (L/min): 5 L/min  Intake/output summary:  Intake/Output Summary (Last 24 hours) at 04/28/15 2313 Last data filed at 04/28/15 1805  Gross per 24 hour  Intake     70 ml  Output     76 ml  Net     -6 ml   LBM: Last BM Date: 04/25/15 Baseline Weight: Weight: 82 kg (180 lb 12.4 oz) Most recent weight: Weight: 87.9 kg (193 lb 12.6 oz)       Palliative Assessment/Data: Flowsheet Rows        Most Recent Value   Intake Tab    Referral Department  Critical care   Unit at Time of Referral  ICU   Palliative Care Primary Diagnosis  Pulmonary   Date Notified  04/26/15   Palliative Care Type  New Palliative care   Reason for referral  Clarify Goals of Care   Date of Admission  04/21/15   # of days IP prior to Palliative referral  5   Clinical Assessment    Psychosocial & Spiritual Assessment    Palliative Care Outcomes       Additional Data Reviewed: CBC    Component Value Date/Time    WBC 5.4 04/26/2015 0321   RBC 3.92 04/26/2015 0321   HGB 10.4* 04/26/2015 0321   HCT 33.8* 04/26/2015 0321   PLT 203 04/26/2015 0321   MCV 86.2 04/26/2015 0321   MCH 26.5 04/26/2015 0321   MCHC 30.8 04/26/2015 0321   RDW 16.9* 04/26/2015 0321   LYMPHSABS 0.2* 04/21/2015 1408   MONOABS 0.3 04/21/2015 1408   EOSABS 0.0 04/21/2015 1408   BASOSABS 0.0 04/21/2015 1408    CMP     Component Value Date/Time   NA 140 04/28/2015 0440   K 3.5 04/28/2015 0440   CL 101 04/28/2015 0440   CO2 31 04/28/2015 0440   GLUCOSE 86 04/28/2015 0440   BUN 42* 04/28/2015 0440   CREATININE 0.88 04/28/2015 0440   CALCIUM 8.6* 04/28/2015 0440   PROT 6.5 04/23/2015 0537   ALBUMIN 3.4* 04/23/2015 0537   AST 37 04/23/2015 0537   ALT 27 04/23/2015 0537   ALKPHOS 72 04/23/2015 0537   BILITOT 0.5 04/23/2015 0537   GFRNONAA >60 04/28/2015 0440   GFRAA >60 04/28/2015 0440       Problem List:  Patient Active Problem List   Diagnosis Date Noted  . Palliative care encounter 04/27/2015  . DNR (do not resuscitate) 04/27/2015  . Influenza with respiratory manifestation 04/26/2015  . Acute on chronic respiratory failure ([place]) 04/25/2015  . COPD exacerbation ([place]) 04/21/2015  . Hyponatremia 04/21/2015  . Hypokalemia 04/21/2015     Palliative Care Assessment & Plan    1.Code Status:  DNR    Code Status Orders        Start  Ordered   04/21/15 1654  Do not attempt resuscitation (DNR)   Continuous    Question Answer Comment  In the event of cardiac or respiratory ARREST Do not call a "code blue"   In the event of cardiac or respiratory ARREST Do not perform Intubation, CPR, defibrillation or ACLS   In the event of cardiac or respiratory ARREST Use medication by any route, position, wound care, and other measures to relive pain and suffering. May use oxygen, suction and manual treatment of airway obstruction as needed for comfort.      04/21/15 1653    Code Status History    Date Active  Date Inactive Code Status Order ID Comments User Context   04/21/2015  4:48 PM 04/21/2015  4:53 PM Full Code [id number]  Debbe Odea, MD ED    Advance Directive Documentation        Most Recent Value   Type of Advance Directive  Living will, Healthcare Power of Attorney   Pre-existing out of facility DNR order (yellow form or pink MOST form)     "MOST" Form in Place?         2. Goals of Care/Additional Recommendations: I discussed at length with family.  They understand that she is at the end of her life, is not improving, and it is likely that she will continue to decompensate. Family would like to pursue placement at residential hospice is she is deemed appropriate.  3. Symptom Management:      1.Shortness of breath: Severely deconditioned and symptoms somewhat improved on current morphine regimen.  There is high likelihood that her symptom management needs will continue to increase and she would be well served by residential hospice placement if deemed appropriate.   4. Palliative Prophylaxis:   Aspiration, Bowel Regimen, Delirium Protocol and Frequent Pain Assessment  5. Prognosis: Ms. Spegal is severely deconditioned and has had significant change over the last week.  Her functional status is severely compromised and she is completely bedbound.  She has tenuous respiratory status and has a high likelihood of continued decompensation.  She has not been able to tolerate Bipap earlier this admission.  I believe it to be very likely that her condition will continue to decline and her prognosis will be less than 2 weeks.  6. Discharge Planning:  Hospice facility   Care plan was discussed with patient family, Dr Tat  Thank you for allowing the Palliative Medicine Team to assist in the care of this patient.   Time In: 4854 Time Out: 1840 Total Time 45 Prolonged Time Billed  No        Micheline Rough, MD  04/28/2015, 11:13 PM  Please contact Palliative Medicine Team phone at [phone]  for questions and concerns.

## 2015-04-28 NOTE — Progress Notes (Signed)
Occupational Therapy Evaluation Patient Details Name: Pamela Benton MRN: [id number] DOB: [birth year] Today's Date: 04/28/2015    History of Present Illness Pamela Benton is a 75 y.o. female with COPD on 4 L of oxygen at home, history of lung cancer status post lobectomy who presents to the hospital 04/21/15 for cough and dyspnea at rest. The patient was treated for pneumonia about a month ago. R/O flu,   Clinical Impression   This 74 year old female was admitted for the above.  She is on 4-5 liters of 02 at baseline but has decreased activity tolerance from baseline. Will follow in acute setting with min A to supervision level goals.      Follow Up Recommendations  SNF;Supervision/Assistance - 24 hour    Equipment Recommendations  3 in 1 bedside comode (if she doesn't have this)    Recommendations for Other Services       Precautions / Restrictions Precautions Precautions: Fall Precaution Comments: droplet Restrictions Weight Bearing Restrictions: No      Mobility Bed Mobility Overal bed mobility: Needs Assistance Bed Mobility: Supine to Sit;Sit to Supine     Supine to sit: Modified independent (Device/Increase time) (extra time) Sit to supine: Mod assist   General bed mobility comments: assist for bil LEs (pt assisted) getting back to bed due to fatique  Transfers Overall transfer level: Needs assistance Equipment used: None Transfers: Sit to/from [facility] Sit to Stand: Supervision Stand pivot transfers: Supervision       General transfer comment: for lines    Balance                                            ADL Overall ADL's : Needs assistance/impaired     Grooming: Set up;Sitting                   Toilet Transfer: Supervision/safety;Stand-pivot;BSC   Toileting- Clothing Manipulation and Hygiene: Supervision/safety;Sit to/from stand         General ADL Comments: pt with increased RR, HR and  decreased sats with activity. Encouraged pursed lip breathing. Did not ask a lot of questions this session.  HR up to 125; RR up to 37 and sats down to 82% at lowest.  Pt was on 5 liters of 02 at time of eval and she reports that she uses 4-5 at baseline.  Needs min A for UB adls and lots of rest breaks and max A for LB dressing with rest breaks     Vision     Perception     Praxis      Pertinent Vitals/Pain Pain Assessment: No/denies pain     Hand Dominance     Extremity/Trunk Assessment             Communication Communication Communication: No difficulties   Cognition Arousal/Alertness: Awake/alert Behavior During Therapy: WFL for tasks assessed/performed Overall Cognitive Status: Within Functional Limits for tasks assessed                     General Comments       Exercises       Shoulder Instructions      Home Living Family/patient expects to be discharged to:: Unsure  Additional Comments: pt states she may stay with one son during week and another over weekend or go to rehab      Prior Functioning/Environment Level of Independence: Independent with assistive device(s)             OT Diagnosis: Generalized weakness   OT Problem List: Decreased strength;Decreased activity tolerance;Pain;Cardiopulmonary status limiting activity   OT Treatment/Interventions: Self-care/ADL training;DME and/or AE instruction;Patient/family education;Therapeutic activities;energy conservation   OT Goals(Current goals can be found in the care plan section) Acute Rehab OT Goals Patient Stated Goal: get back to baseline OT Goal Formulation: With patient Time For Goal Achievement: 05/04/15 Potential to Achieve Goals: Good ADL Goals Pt Will Transfer to Toilet: with supervision;stand pivot transfer;bedside commode Pt Will Perform Toileting - Clothing Manipulation and hygiene: with supervision;sit to/from stand Additional  ADL Goal #1: pt will complete UB adl with set up; LB adl with min A and initiate at least one rest break for energy conservation  OT Frequency: Min 2X/week   Barriers to D/C:            Co-evaluation              End of Session    Activity Tolerance: Treatment limited secondary to medical complications (Comment) Patient left: in bed;with call bell/phone within reach (with HOB raised)   Time: [id number] OT Time Calculation (min): 18 min Charges:  OT General Charges $OT Visit: 1 Procedure OT Evaluation $OT Eval Low Complexity: 1 Procedure G-Codes:    [name] Performed on 04/27/15 Late entry on 04/28/15 [name], OTR/L [id number] 04/28/2015,

## 2015-04-28 NOTE — Progress Notes (Signed)
Pt transferred from ICU.  Sisters at bedside state, "Palliative care meeting at [facility] and pt'son is HCPOA."  Will continue to follow.

## 2015-04-28 NOTE — Care Management Important Message (Signed)
Important Message  Patient Details  Name: Pamela Benton MRN: [id number] Date of Birth: 14-Sep-1940   Medicare Important Message Given:  Yes    Camillo Flaming 04/28/2015, 2:23 [place] Message  Patient Details  Name: Pamela Benton MRN: [id number] Date of Birth: 1940/12/10   Medicare Important Message Given:  Yes    Camillo Flaming 04/28/2015, 2:23 PM

## 2015-04-29 LAB — BASIC METABOLIC PANEL
ANION GAP: 8 (ref 5–15)
BUN: 37 mg/dL — ABNORMAL HIGH (ref 6–20)
CHLORIDE: 103 mmol/L (ref 101–111)
CO2: 31 mmol/L (ref 22–32)
CREATININE: 0.78 mg/dL (ref 0.44–1.00)
Calcium: 8.9 mg/dL (ref 8.9–10.3)
GFR calc non Af Amer: >60 mL/min (ref >60)
Glucose, Bld: 85 mg/dL (ref 65–99)
POTASSIUM: 3.9 mmol/L (ref 3.5–5.1)
Sodium: 142 mmol/L (ref 135–145)

## 2015-04-29 MED ORDER — PREDNISONE 20 MG PO TABS: 40.0000 mg | ORAL_TABLET | Freq: Every day | ORAL | Status: AC

## 2015-04-29 MED ORDER — ALPRAZOLAM 0.5 MG PO TABS: 0.5000 mg | ORAL_TABLET | Freq: Every day | ORAL | Status: AC

## 2015-04-29 MED ORDER — IPRATROPIUM-ALBUTEROL 0.5-2.5 (3) MG/3ML IN SOLN: 3.0000 mL | Freq: Four times a day (QID) | RESPIRATORY_TRACT | Status: AC

## 2015-04-29 MED ORDER — HYDROCOD POLST-CPM POLST ER 10-8 MG/5ML PO SUER: 5.0000 mL | Freq: Two times a day (BID) | ORAL | Status: AC

## 2015-04-29 MED ORDER — MORPHINE SULFATE (CONCENTRATE) 10 MG/0.5ML PO SOLN: 5.0000 mg | ORAL | Status: AC | PRN

## 2015-04-29 NOTE — Progress Notes (Signed)
CSW consulted today for assistance with Hospice Home placement. CSW spoke with pt's son to provide choice. Pt / son have chosen [facility] for hospice placement. Erling Conte, liaison, from [facility] confirmed bed availability for today. D/C Summary has been sent to [facility] for review. [facility] transport is required. Medical necessity form completed. # for report provided to nsg. Pt / son are in agreement with d/c to [facility] today.  Werner Lean LCSW [phone]

## 2015-04-29 NOTE — Care Management Note (Signed)
Case Management Note  Patient Details  Name: Pamela Benton MRN: [id number] Date of Birth: 1940-09-14  Subjective/Objective:        COPD exac, Influenza             Action/Plan: CSW referral for Residential Hospice. Family has decided on [facility].   Expected Discharge Date:  04/30/2015              Expected Discharge Plan:  [place]  In-House Referral:  Clinical Social Work  Discharge planning Services  NA  Post Acute Care Choice:  NA Choice offered to:  NA  DME Arranged:  N/A DME Agency:  NA  HH Arranged:  NA HH Agency:  NA  Status of Service:  Completed, signed off  Medicare Important Message Given:  Yes Date Medicare IM Given:    Medicare IM give by:    Date Additional Medicare IM Given:    Additional Medicare Important Message give by:     If discussed at [place] of Stay Meetings, dates discussed:    Additional Comments:  Erenest Rasher, RN 04/29/2015, 10:17 AM

## 2015-04-29 NOTE — Discharge Summary (Signed)
Physician Discharge Summary  Pamela Benton [id number] DOB: Dec 12, 1940 DOA: 04/21/2015  PCP: No primary care provider on file.  Admit date: 04/21/2015 Discharge date: 04/29/2015  PATIENT DISCHARGING TO RESIDENTIAL HOSPICE    Discharge Diagnoses:  Acute on chronic respiratory failure with hypoxia -secondary to influenza and COPD exacerbation -appreciate pulmonary followup -04/27/15--case discussed with pulmonary--Dr. Ramaswamy-->overall poor prognosis with unpredictable prolonged recovery -pt unable to tolerate BiPAP -palliative medicine consult appreciated -04/28/15- case discussed with palliative medicine -scheduled morphine to help with distress/dyspnea -pt with no pulmonary reserve/very deconditioned--essentially bed bound -without scheduled morphine/alprazolam, pt had significant distress with minimal exertion -clinically little change over past several days -pt with high morbidity and poor prognosis with high likelihood of death in next few weeks  COPD exacerbation/ Influenza B - cont nebs, steroids, Dileresp, Tussionex and supplemental O2- using about 5-6 L O2- on 4 L at baseline - Finished Tamiflu D#5 -Mucinex, Pulmicort -continue doxycycline D#7/7 --pt with no pulmonary reserve/very deconditioned--essentially bed bound -anticipate very slow recovery with high morbidity -04/27/15--pulmonary signed off -04/27/15--switched to po prednisone  Hyponatremia - improved with IV normal salilne and with holding Lasix  Hypokalemia - resolved with replacement  Prolonged QT - avoid medications that will further prolong QT  Hx of lung cancer -s/p lobectomy  Discharge Condition: stable  Disposition: residential hospice  Diet:regular Wt Readings from Last 3 Encounters:  04/25/15 87.9 kg (193 lb 12.6 oz)    History of present illness:  Pamela Benton is a 75 y.o. female with COPD on 4 L of oxygen at home, history of lung cancer status post lobectomy who presents to the  hospital for cough and dyspnea at rest. The patient was treated for pneumonia about a month ago and had improved however, developed a severe cough with shortness of breath at rest and with exertion a couple of days ago. Nebulizer treatments used at home did not help. She felt tightness in her chest. No complaint of fevers. Cough is productive of clear sputum. No sore throat and runny nose or watery eyes. She is noted to be quite short of breath in the ER and has persistent cough and is therefore being admitted. Pt was seen by pulmonary medicine.  Maximal therapeutic options were instituted.  The patient did not tolerate BiPAP.  She did not show much clinical improvement.  She has poor prognosis with high morbidity with high risk of death.  Palliative medicine was consulted and after discussion with the family, they were in agreement with residential hospice.  With the assistance of social work, the patient was transferred to residential hospice.  Consultants: Pulmonary medicine  Discharge Exam: Filed Vitals:   04/29/15 0522 04/29/15 0718  BP: 121/54   Pulse: 107 109  Temp: 97.6 F (36.4 C)   Resp: 18 20   Filed Vitals:   04/28/15 2129 04/29/15 0311 04/29/15 0522 04/29/15 0718  BP:   121/54   Pulse:   107 109  Temp:   97.6 F (36.4 C)   TempSrc:   Oral   Resp:   18 20  Height:      Weight:      SpO2: 93% 94% 93%    General: Alert and awake. pleasant, cooperative Cardiovascular: RRR, no rub, no gallop, no S3 Respiratory: bilateral scattered rales Abdomen:soft, nontender, nondistended, positive bowel sounds Extremities: 1+ LE edema, No lymphangitis, no petechiae  Discharge Instructions      Discharge Instructions    Increase activity slowly    Complete by:  As directed             Medication List    STOP taking these medications        furosemide 20 MG tablet  Commonly known as:  LASIX      TAKE these medications        ALPRAZolam 0.5 MG tablet  Commonly known as:   XANAX  Take 0.5 mg by mouth daily.     chlorpheniramine-HYDROcodone 10-8 MG/5ML Suer  Commonly known as:  TUSSIONEX  Take 5 mLs by mouth every 12 (twelve) hours.     citalopram 20 MG tablet  Commonly known as:  CELEXA  Take 20 mg by mouth daily.     DALIRESP 500 MCG Tabs tablet  Generic drug:  roflumilast  Take 500 mcg by mouth daily.     ipratropium-albuterol 0.5-2.5 (3) MG/3ML Soln  Commonly known as:  DUONEB  Take 3 mLs by nebulization 4 (four) times daily.     morphine CONCENTRATE 10 MG/0.5ML Soln concentrated solution  Place 0.25 mLs (5 mg total) under the tongue every 2 (two) hours as needed for severe pain or shortness of breath.     NAMENDA XR 28 MG Cp24 24 hr capsule  Generic drug:  memantine  Take 28 mg by mouth daily.     predniSONE 20 MG tablet  Commonly known as:  DELTASONE  Take 2 tablets (40 mg total) by mouth daily with breakfast.         The results of significant diagnostics from this hospitalization (including imaging, microbiology, ancillary and laboratory) are listed below for reference.    Significant Diagnostic Studies: Dg Chest 1 View  04/23/2015  CLINICAL DATA:  Patient with shortness of breath. EXAM: CHEST 1 VIEW COMPARISON:  Chest radiograph 04/21/2015 FINDINGS: Stable cardiac and mediastinal contours. Interval increase in coarse interstitial opacities right-greater-than-left lung base. Persistent right perihilar opacity. Apical emphysematous change. Left chest wall surgical clips. IMPRESSION: Interval increase in basilar opacities which may represent superimposed infectious process on chronic fibrosis. Grossly unchanged right parahilar soft tissue which may represent post radiation fibrosis. Neoplasm is not excluded. Electronically Signed   By: Lovey Newcomer M.D.   On: 04/23/2015 16:28   Dg Chest 2 View  04/21/2015  CLINICAL DATA:  Shortness of breath today. History of lung cancer. Initial encounter. EXAM: CHEST  2 VIEW COMPARISON:  None. FINDINGS:  Coarse bibasilar airspace opacities are identified at appears most suggestive of fibrotic change. The lungs are emphysematous. A right hilar mass lesion is seen. Surgical clips are noted left axilla. There is no pneumothorax or pleural effusion. Heart size is normal. No pneumothorax. IMPRESSION: Emphysematous lungs with coarse bibasilar airspace opacities likely due to fibrosis although pneumonia cannot be excluded. Right hilar mass. Electronically Signed   By: Inge Rise M.D.   On: 04/21/2015 13:52   Ct Angio Chest Pe W/cm &/or Wo Cm  04/21/2015  CLINICAL DATA:  Respiratory distress in a patient with lung cancer. EXAM: CT ANGIOGRAPHY CHEST WITH CONTRAST TECHNIQUE: Multidetector CT imaging of the chest was performed using the standard protocol during bolus administration of intravenous contrast. Multiplanar CT image reconstructions and MIPs were obtained to evaluate the vascular anatomy. CONTRAST:  152mL OMNIPAQUE IOHEXOL 350 MG/ML SOLN COMPARISON:  None. FINDINGS: Mediastinum / Lymph Nodes: There is no axillary lymphadenopathy. No mediastinal lymphadenopathy. No evidence for left hilar lymphadenopathy. 8 mm short axis lymph node is identified in the right hilum. The heart size is normal. No pericardial effusion. Coronary artery  calcification is noted. No evidence for filling defect in the opacified pulmonary arteries although evaluation of the lower lobes is degraded by respiratory motion. No thoracic aortic aneurysm. No evidence for dissection flap in the thoracic aorta. Lungs / Pleura: Moderate to severe emphysema noted bilaterally. Volume loss in the right hemi thorax is associated with confluent soft tissue attenuation in the anteromedial right chest which may be related to previous radiation therapy. No evidence for pulmonary edema. No pleural effusion. Upper Abdomen: 1.6 x 2.1 cm cystic lesion identified in the left upper abdomen has been incompletely visualized, but is suspicious for a cystic lesion  of the pancreatic tail. MSK / Soft Tissues: Bone windows reveal no worrisome lytic or sclerotic osseous lesions. Review of the MIP images confirms the above findings. IMPRESSION: 1. No CT evidence for acute pulmonary embolus. 2. 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 Comparison to previous imaging studies is recommended to ensure that this is stable as recurrent neoplasm could have this appearance. 3. 2.1 cm cystic lesion in the left upper abdomen has been incompletely visualized but is suspicious for a cystic process in the tail the pancreas. Pancreatic protocol CT or MR could be used to further evaluate. Electronically Signed   By: Misty Stanley M.D.   On: 04/21/2015 16:01     Microbiology: Recent Results (from the past 240 hour(s))  Respiratory virus panel     Status: Abnormal   Collection Time: 04/22/15  1:44 AM  Result Value Ref Range Status   Respiratory Syncytial Virus A Negative Negative Final   Respiratory Syncytial Virus B Negative Negative Final   Influenza A Negative Negative Final   Influenza B Positive (A) Negative Final   Parainfluenza 1 Negative Negative Final   Parainfluenza 2 Negative Negative Final   Parainfluenza 3 Negative Negative Final   Metapneumovirus Negative Negative Final   Rhinovirus Negative Negative Final   Adenovirus Negative Negative Final    Comment: (NOTE) Performed At: [facility] [place], [place] [id number] Lindon Romp MD [id number]   MRSA PCR Screening     Status: None   Collection Time: 04/23/15  7:41 PM  Result Value Ref Range Status   MRSA by PCR NEGATIVE NEGATIVE Final    Comment:        The GeneXpert MRSA Assay (FDA approved for NASAL specimens only), is one component of a comprehensive MRSA colonization surveillance program. It is not intended to diagnose MRSA infection nor to guide or monitor treatment  for MRSA infections.      Labs: Basic Metabolic Panel:  Recent Labs Lab 04/25/15 0314 04/26/15 0321 04/27/15 0328 04/28/15 0440 04/29/15 0543  NA 140 138 137 140 142  K 4.2 4.6 4.4 3.5 3.9  CL 102 100* 100* 101 103  CO2 28 30 31 31 31   GLUCOSE 126* 110* 114* 86 85  BUN 23* 25* 27* 42* 37*  CREATININE 0.81 0.87 0.77 0.88 0.78  CALCIUM 8.6* 8.8* 8.5* 8.6* 8.9  MG 2.2 2.4 2.2 2.4  --   PHOS 3.4 3.9 4.3 3.1  --    Liver Function Tests:  Recent Labs Lab 04/23/15 0537  AST 37  ALT 27  ALKPHOS 72  BILITOT 0.5  PROT 6.5  ALBUMIN 3.4*   No results for input(s): LIPASE, AMYLASE in the last 168 hours. No results for input(s): AMMONIA in the last 168 hours. CBC:  Recent Labs Lab 04/23/15 0537 04/26/15  0321  WBC 4.4 5.4  HGB 10.9* 10.4*  HCT 35.7* 33.8*  MCV 86.7 86.2  PLT 214 203   Cardiac Enzymes: No results for input(s): CKTOTAL, CKMB, CKMBINDEX, TROPONINI in the last 168 hours. BNP: Invalid input(s): POCBNP CBG: No results for input(s): GLUCAP in the last 168 hours.  Time coordinating discharge:  Greater than 30 minutes  Signed:  Nole Robey, DO [facility] Pager: [phone] 04/29/2015, 11:45 AM

## 2015-04-29 NOTE — Progress Notes (Signed)
Called report to [facility].

## 2015-05-27 DEATH — deceased

## 2017-10-14 IMAGING — DX DG CHEST 1V
1 series · 1 of 1 positions shown · non-contrast
Comparison: Chest radiograph 04/21/2015

CLINICAL DATA: Patient with shortness of breath.

EXAM:
CHEST 1 VIEW

[chest ap]
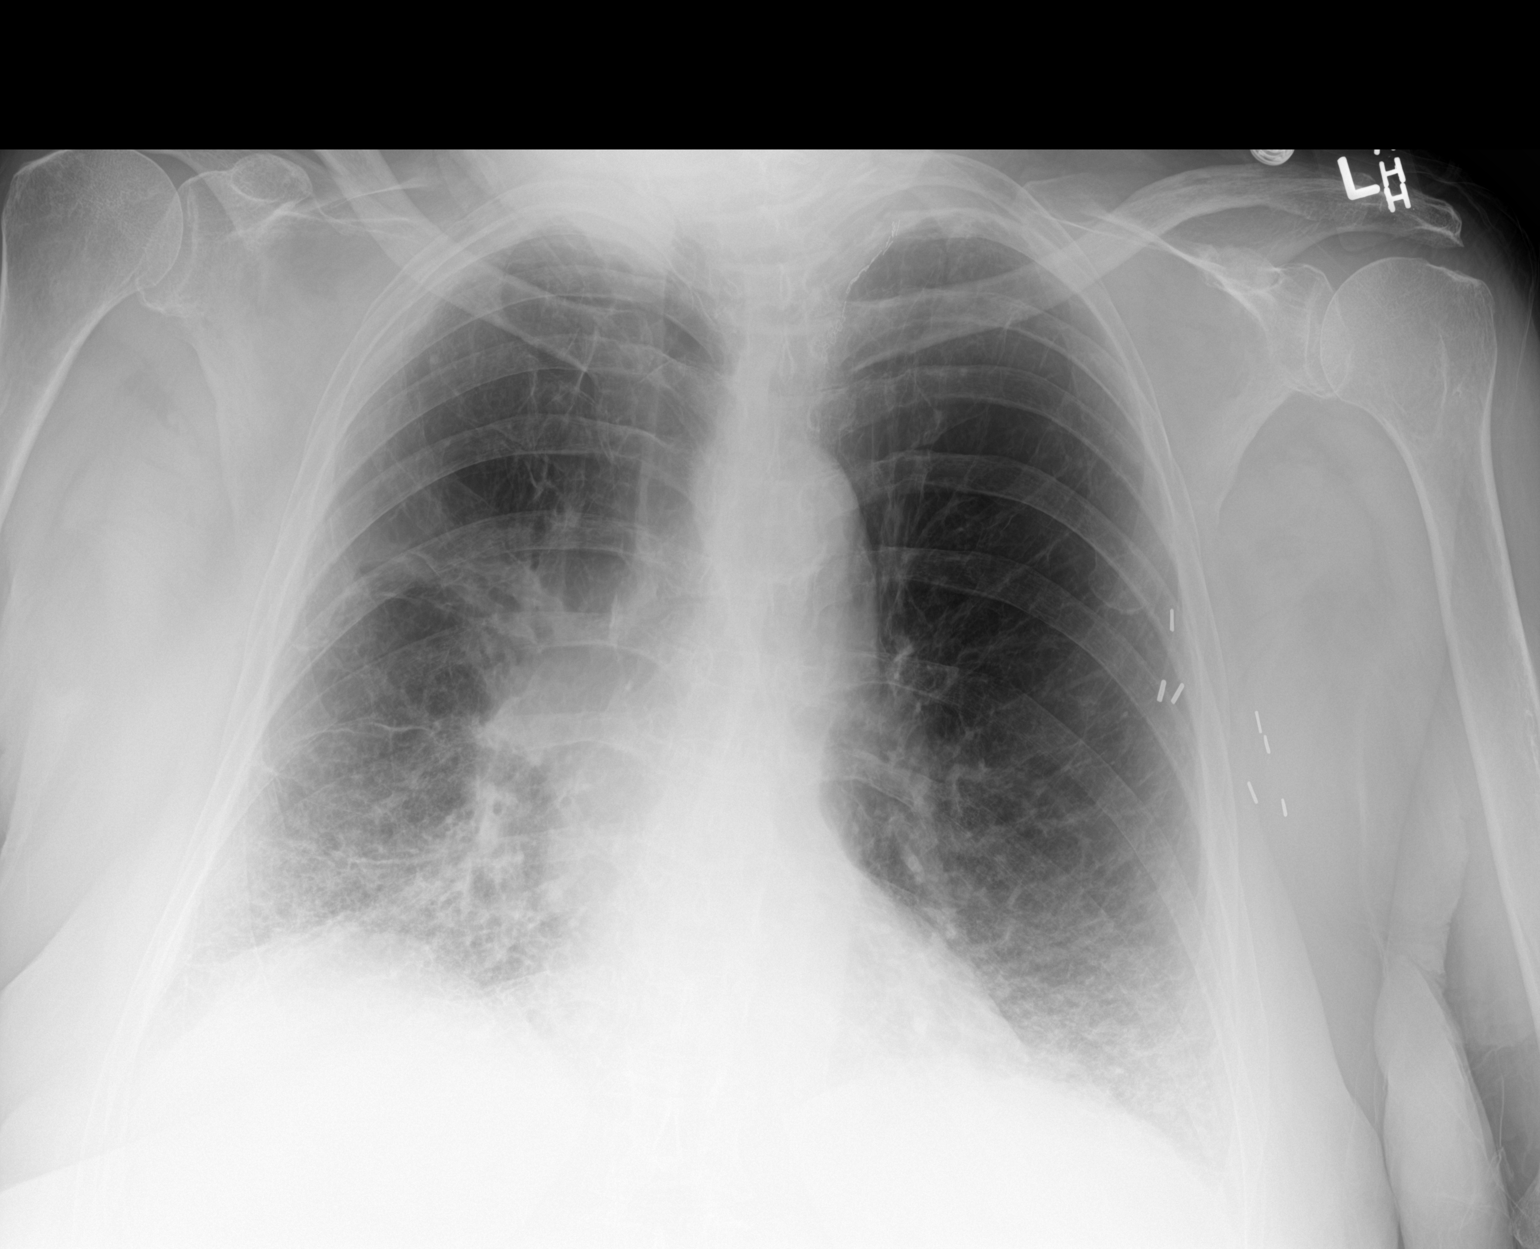

[1 of 1 positions shown; findings below may reference images not displayed]

FINDINGS: Stable cardiac and mediastinal contours. Interval increase in coarse
interstitial opacities right-greater-than-left lung base. Persistent
right perihilar opacity. Apical emphysematous change. Left chest
wall surgical clips.
IMPRESSION: Interval increase in basilar opacities which may represent
superimposed infectious process on chronic fibrosis.

Grossly unchanged right parahilar soft tissue which may represent
post radiation fibrosis. Neoplasm is not excluded.
